# Patient Record
Sex: Male | Born: 1975 | Race: Black or African American | Hispanic: No | Marital: Married | Smoking: Current every day smoker
Health system: Southern US, Community
[De-identification: ages and names within clinical notes are randomized; demographics above are authoritative.]

## PROBLEM LIST (undated history)

## (undated) DIAGNOSIS — M797 Fibromyalgia: Secondary | ICD-10-CM

## (undated) DIAGNOSIS — I10 Essential (primary) hypertension: Secondary | ICD-10-CM

## (undated) DIAGNOSIS — F419 Anxiety disorder, unspecified: Secondary | ICD-10-CM

## (undated) HISTORY — DX: Anxiety disorder, unspecified: F41.9

## (undated) HISTORY — PX: NO PAST SURGERIES: SHX2092

---

## 2003-03-28 ENCOUNTER — Ambulatory Visit (HOSPITAL_BASED_OUTPATIENT_CLINIC_OR_DEPARTMENT_OTHER): Admission: RE | Admit: 2003-03-28 | Discharge: 2003-03-28 | Payer: Self-pay | Admitting: Orthopedic Surgery

## 2008-05-17 ENCOUNTER — Encounter: Admission: RE | Admit: 2008-05-17 | Discharge: 2008-05-17 | Payer: Self-pay | Admitting: Cardiology

## 2011-04-04 NOTE — Op Note (Signed)
   NAME:  James Good, James Good                       ACCOUNT NO.:  [id number]   MEDICAL RECORD NO.:  [id number]                   PATIENT TYPE:  AMB   LOCATION:  DSC                                  FACILITY:  [facility]   PHYSICIAN:  Rodney A. Chaney Malling, M.D.           DATE OF BIRTH:  Aug 27, 1976   DATE OF PROCEDURE:  03/28/2003  DATE OF DISCHARGE:                                 OPERATIVE REPORT   PREOPERATIVE DIAGNOSIS:  Fracture, base fifth metacarpal, right hand, with  dislocation.   POSTOPERATIVE DIAGNOSIS:  Fracture, base fifth metacarpal, right hand, with  dislocation.   OPERATION:  Closed reduction of fracture-dislocation fifth metacarpal and  pin fixation with 0.620 K-wire.   SURGEON:  Lenard Galloway. Chaney Malling, M.D.   ANESTHESIA:  General.   PROCEDURE:  The patient was placed on the operating table in the supine  position.  After satisfactory general anesthesia, the fingers were placed in  fingertrap.  C-arm was brought in place.  The hand and right upper extremity  was then prepped with DuraPrep and draped out in the usual manner.  The  fracture was felt.  This was reduced manually.  In a reduced position, a K-  wire was placed across the proximal end of the fifth metacarpal and across  the fracture line to stabilize the fracture.  This was visualized with the C-  arm, and excellent reduction and stabilization was achieved.  Excess wire  was removed.  A short arm cast was then applied, and the patient returned to  the recovery room in excellent condition.  Technically, the procedure went  extremely well.   FOLLOW-UP CARE:  Return to my office in one week.                                               Rodney A. Chaney Malling, M.D.    RAM/MEDQ  D:  03/28/2003  T:  03/29/2003  Job:  [id number]

## 2013-09-02 ENCOUNTER — Emergency Department (HOSPITAL_COMMUNITY)
Admission: EM | Admit: 2013-09-02 | Discharge: 2013-09-02 | Disposition: A | Discharge status: Home / Self Care | Payer: Self-pay | Attending: Emergency Medicine | Admitting: Emergency Medicine

## 2013-09-02 ENCOUNTER — Encounter (HOSPITAL_COMMUNITY): Payer: Self-pay | Admitting: Emergency Medicine

## 2013-09-02 ENCOUNTER — Emergency Department (HOSPITAL_COMMUNITY): Payer: Self-pay

## 2013-09-02 DIAGNOSIS — IMO0001 Reserved for inherently not codable concepts without codable children: Secondary | ICD-10-CM | POA: Insufficient documentation

## 2013-09-02 DIAGNOSIS — F17200 Nicotine dependence, unspecified, uncomplicated: Secondary | ICD-10-CM | POA: Insufficient documentation

## 2013-09-02 DIAGNOSIS — R569 Unspecified convulsions: Secondary | ICD-10-CM | POA: Insufficient documentation

## 2013-09-02 DIAGNOSIS — Z79899 Other long term (current) drug therapy: Secondary | ICD-10-CM | POA: Insufficient documentation

## 2013-09-02 DIAGNOSIS — R51 Headache: Secondary | ICD-10-CM | POA: Insufficient documentation

## 2013-09-02 LAB — COMPREHENSIVE METABOLIC PANEL
ALT: 13 U/L (ref 0–53)
AST: 23 U/L (ref 0–37)
Albumin: 3.8 g/dL (ref 3.5–5.2)
Alkaline Phosphatase: 87 U/L (ref 39–117)
BUN: 6 mg/dL (ref 6–23)
CO2: 23 mEq/L (ref 19–32)
Calcium: 9.2 mg/dL (ref 8.4–10.5)
Chloride: 103 mEq/L (ref 96–112)
Creatinine, Ser: 1.09 mg/dL (ref 0.50–1.35)
GFR calc Af Amer: >90 mL/min (ref >90)
GFR calc non Af Amer: 86 mL/min — ABNORMAL LOW (ref >90)
Glucose, Bld: 107 mg/dL — ABNORMAL HIGH (ref 70–99)
Potassium: 3.5 mEq/L (ref 3.5–5.1)
Sodium: 136 mEq/L (ref 135–145)
Total Bilirubin: 0.3 mg/dL (ref 0.3–1.2)
Total Protein: 7 g/dL (ref 6.0–8.3)

## 2013-09-02 LAB — CBC WITH DIFFERENTIAL/PLATELET
Basophils Absolute: 0 10*3/uL (ref 0.0–0.1)
Basophils Relative: 0 % (ref 0–1)
Eosinophils Absolute: 0.1 10*3/uL (ref 0.0–0.7)
Eosinophils Relative: 1 % (ref 0–5)
HCT: 45.4 % (ref 39.0–52.0)
Hemoglobin: 15.9 g/dL (ref 13.0–17.0)
Lymphocytes Relative: 18 % (ref 12–46)
Lymphs Abs: 2.1 10*3/uL (ref 0.7–4.0)
MCH: 32.6 pg (ref 26.0–34.0)
MCHC: 35 g/dL (ref 30.0–36.0)
MCV: 93.2 fL (ref 78.0–100.0)
Monocytes Absolute: 1.1 10*3/uL — ABNORMAL HIGH (ref 0.1–1.0)
Monocytes Relative: 10 % (ref 3–12)
Neutro Abs: 8.2 10*3/uL — ABNORMAL HIGH (ref 1.7–7.7)
Neutrophils Relative %: 71 % (ref 43–77)
Platelets: 235 10*3/uL (ref 150–400)
RBC: 4.87 MIL/uL (ref 4.22–5.81)
RDW: 14 % (ref 11.5–15.5)
WBC: 11.5 10*3/uL — ABNORMAL HIGH (ref 4.0–10.5)

## 2013-09-02 LAB — URINALYSIS, ROUTINE W REFLEX MICROSCOPIC
Bilirubin Urine: NEGATIVE
Glucose, UA: NEGATIVE mg/dL
Hgb urine dipstick: NEGATIVE
Ketones, ur: NEGATIVE mg/dL
Leukocytes, UA: NEGATIVE
Nitrite: NEGATIVE
Protein, ur: NEGATIVE mg/dL
Specific Gravity, Urine: 1.006 (ref 1.005–1.030)
Urobilinogen, UA: 0.2 mg/dL (ref 0.0–1.0)
pH: 5.5 (ref 5.0–8.0)

## 2013-09-02 LAB — CK: Total CK: 962 U/L — ABNORMAL HIGH (ref 7–232)

## 2013-09-02 MED ORDER — SODIUM CHLORIDE 0.9 % IV BOLUS (SEPSIS)
2000.0000 mL | Freq: Once | INTRAVENOUS | Status: AC
  Administered 2013-09-02: 2000 mL via INTRAVENOUS

## 2013-09-02 NOTE — ED Notes (Signed)
Patient transported to CT 

## 2013-09-02 NOTE — ED Notes (Signed)
Pt discharged home with all belongings, pt alert and ambulatory upon discharge, no new RX prescribed, pt verbalizes understanding of discharge instructions  

## 2013-09-02 NOTE — ED Provider Notes (Signed)
Medical screening examination/treatment/procedure(s) were conducted as a shared visit with non-physician practitioner(s) and myself.  I personally evaluated the patient during the encounter.  Significant other describes Pt in bed having witnessed few minutes episode of generalized tonic clonic seizure activity, then post-ictal snoring then woke with transient confusion, Pt now back to baseline, patient has amnesia for the event, patient denies headache neck pain back pain chest pain shortness breath palpitations abdominal pain focal weakness or numbness or incoordination change in speech or vision swallowing or other concerns. No tongue-biting noted.  Clinically doubt acute coronary syndrome / STEMI / CVA / SBI. Suspect ST elevation on ECG not from STEMI.    Hurman Horn, MD 09/18/13 1910

## 2013-09-02 NOTE — ED Notes (Signed)
Seizure pads placed on side rails

## 2013-09-02 NOTE — ED Notes (Signed)
EDPA Lawyer at bedside. 

## 2013-09-02 NOTE — ED Provider Notes (Signed)
CSN: [id number]     Arrival date & time 09/02/13  0447 History   First MD Initiated Contact with Patient 09/02/13 [phone]     Chief Complaint  Patient presents with  . Seizures   (Consider location/radiation/quality/duration/timing/severity/associated sxs/prior Treatment) HPI Patient presents emergency department with possible seizure activity.  Patient's significant other states the patient was shaking all over and finally awoke and was somewhat confused.  Patient denies chest pain, shortness of breath, nausea, vomiting, blurred vision, weakness, numbness, dizziness, or fever.  Patient has a mild headache, and muscle cramping.  Patient has never had a seizure in the past History reviewed. No pertinent past medical history. History reviewed. No pertinent past surgical history. History reviewed. No pertinent family history. History  Substance Use Topics  . Smoking status: Current Every Day Smoker  . Smokeless tobacco: Not on file  . Alcohol Use: Yes    Review of Systems All other systems negative except as documented in the HPI. All pertinent positives and negatives as reviewed in the HPI. Allergies  Review of patient's allergies indicates no known allergies.  Home Medications   Current Outpatient Rx  Name  Route  Sig  Dispense  Refill  . traZODone (DESYREL) 50 MG tablet   Oral   Take 50 mg by mouth at bedtime.          BP 120/68  Pulse 56  Temp(Src) 98.3 F (36.8 C)  Resp 13  SpO2 99% Physical Exam  Nursing note and vitals reviewed. Constitutional: He is oriented to person, place, and time. He appears well-developed and well-nourished.  Eyes: Pupils are equal, round, and reactive to light.  Neck: Normal range of motion. Neck supple.  Cardiovascular: Normal rate, regular rhythm and normal heart sounds.  Exam reveals no gallop and no friction rub.   No murmur heard. Pulmonary/Chest: Effort normal and breath sounds normal.  Neurological: He is alert and oriented to  person, place, and time. He exhibits normal muscle tone. Coordination normal.  Skin: Skin is warm and dry.    ED Course  Procedures (including critical care time) Labs Review Labs Reviewed  CBC WITH DIFFERENTIAL - Abnormal; Notable for the following:    WBC 11.5 (*)    Neutro Abs 8.2 (*)    Monocytes Absolute 1.1 (*)    All other components within normal limits  COMPREHENSIVE METABOLIC PANEL - Abnormal; Notable for the following:    Glucose, Bld 107 (*)    GFR calc non Af Amer 86 (*)    All other components within normal limits  CK - Abnormal; Notable for the following:    Total CK 962 (*)    All other components within normal limits  URINALYSIS, ROUTINE W REFLEX MICROSCOPIC   Imaging Review Ct Head Wo Contrast  09/02/2013   *RADIOLOGY REPORT*  Clinical Data: Seizure  CT HEAD WITHOUT CONTRAST  Technique:  Contiguous axial images were obtained from the base of the skull through the vertex without contrast. Study was obtained within 24 hours of patient arrival at the emergency department.  Comparison: May 17, 2008  Findings:  The ventricles are normal in size and configuration. There is no mass, hemorrhage, extra-axial fluid collection, or midline shift.  Gray-white compartments are normal.  No demonstrable acute infarct.  Bony calvarium appears intact. Visualized mastoid air cells are clear.  IMPRESSION: Study within normal limits.   Original Report Authenticated By: Bretta Bang, M.D.    EKG Interpretation     Ventricular Rate:  65 PR  Interval:  144 QRS Duration: 70 QT Interval:  380 QTC Calculation: 396 R Axis:   114 Text Interpretation:  Right and left arm electrode reversal, interpretation assumes no reversal Sinus rhythm Probable right ventricular hypertrophy Nonspecific ST elevation Diffuse leads No previous ECGs available           Patient will be given outpatient followup for possible seizure.  Patient has been stable here in the emergency department with no  issues.  Patient is advised this could be a first-time seizure, but followup will be needed to further tests for any other possibilities.  Patient is advised this test results.  Told to return here as needed.  All questions were answered.  The patient voices an understanding    Carlyle Dolly, [place] 09/02/13 1011

## 2013-09-02 NOTE — ED Notes (Signed)
Family at bedside. 

## 2013-09-02 NOTE — ED Notes (Signed)
EDPA at bedside aware of pain, no new orders

## 2013-09-02 NOTE — ED Notes (Signed)
Pt returned from CT, upon arrival to [place] NT placed pt back on to heart monitor, applied seizure pads, and obtained orthostatic VS

## 2013-09-02 NOTE — ED Notes (Addendum)
Pt arrives via EMS from home with seizure activity that lasted about 1 min.  4mg  Zofran given.  Pt now C/O headache

## 2014-06-22 ENCOUNTER — Emergency Department (HOSPITAL_COMMUNITY)
Admission: EM | Admit: 2014-06-22 | Discharge: 2014-06-22 | Disposition: A | Discharge status: Home / Self Care | Payer: Self-pay | Attending: Emergency Medicine | Admitting: Emergency Medicine

## 2014-06-22 ENCOUNTER — Encounter (HOSPITAL_COMMUNITY): Payer: Self-pay | Admitting: Emergency Medicine

## 2014-06-22 DIAGNOSIS — R5383 Other fatigue: Secondary | ICD-10-CM

## 2014-06-22 DIAGNOSIS — G40909 Epilepsy, unspecified, not intractable, without status epilepticus: Secondary | ICD-10-CM | POA: Insufficient documentation

## 2014-06-22 DIAGNOSIS — Z79899 Other long term (current) drug therapy: Secondary | ICD-10-CM | POA: Insufficient documentation

## 2014-06-22 DIAGNOSIS — F17200 Nicotine dependence, unspecified, uncomplicated: Secondary | ICD-10-CM | POA: Insufficient documentation

## 2014-06-22 DIAGNOSIS — R569 Unspecified convulsions: Secondary | ICD-10-CM | POA: Insufficient documentation

## 2014-06-22 DIAGNOSIS — R5381 Other malaise: Secondary | ICD-10-CM | POA: Insufficient documentation

## 2014-06-22 LAB — URINE MICROSCOPIC-ADD ON: RBC / HPF: NONE SEEN RBC/hpf (ref <3)

## 2014-06-22 LAB — URINALYSIS, ROUTINE W REFLEX MICROSCOPIC
Bilirubin Urine: NEGATIVE
Glucose, UA: NEGATIVE mg/dL
Ketones, ur: NEGATIVE mg/dL
Leukocytes, UA: NEGATIVE
Nitrite: NEGATIVE
Protein, ur: NEGATIVE mg/dL
Specific Gravity, Urine: 1.012 (ref 1.005–1.030)
Urobilinogen, UA: 0.2 mg/dL (ref 0.0–1.0)
pH: 5.5 (ref 5.0–8.0)

## 2014-06-22 LAB — I-STAT CHEM 8, ED
BUN: <3 mg/dL — ABNORMAL LOW (ref 6–23)
Calcium, Ion: 1.23 mmol/L (ref 1.12–1.23)
Chloride: 105 mEq/L (ref 96–112)
Creatinine, Ser: 1.3 mg/dL (ref 0.50–1.35)
Glucose, Bld: 101 mg/dL — ABNORMAL HIGH (ref 70–99)
HCT: 53 % — ABNORMAL HIGH (ref 39.0–52.0)
Hemoglobin: 18 g/dL — ABNORMAL HIGH (ref 13.0–17.0)
Potassium: 3.5 mEq/L — ABNORMAL LOW (ref 3.7–5.3)
Sodium: 139 mEq/L (ref 137–147)
TCO2: 16 mmol/L (ref 0–100)

## 2014-06-22 LAB — CBG MONITORING, ED: GLUCOSE-CAPILLARY: 103 mg/dL — AB (ref 70–99)

## 2014-06-22 MED ORDER — LEVETIRACETAM 500 MG PO TABS: 500.0000 mg | ORAL_TABLET | Freq: Two times a day (BID) | ORAL | Status: DC

## 2014-06-22 MED ORDER — LEVETIRACETAM IN NACL 500 MG/100ML IV SOLN
500.0000 mg | Freq: Once | INTRAVENOUS | Status: AC
  Administered 2014-06-22: 500 mg via INTRAVENOUS
  Filled 2014-06-22: qty 100

## 2014-06-22 NOTE — ED Provider Notes (Signed)
Medical screening examination/treatment/procedure(s) were conducted as a shared visit with non-physician practitioner(s) and myself.  I personally evaluated the patient during the encounter  Please see my separate respective documentation pertaining to this patient encounter   Vida RollerBrian D Halil Rentz, MD 06/22/14 [phone]

## 2014-06-22 NOTE — Discharge Instructions (Signed)
Seizure, Adult °A seizure is abnormal electrical activity in the brain. Seizures usually last from 30 seconds to 2 minutes. There are various types of seizures. °Before a seizure, you may have a warning sensation (aura) that a seizure is about to occur. An aura may include the following symptoms:  °· Fear or anxiety. °· Nausea. °· Feeling like the room is spinning (vertigo). °· Vision changes, such as seeing flashing lights or spots. °Common symptoms during a seizure include: °· A change in attention or behavior (altered mental status). °· Convulsions with rhythmic jerking movements. °· Drooling. °· Rapid eye movements. °· Grunting. °· Loss of bladder and bowel control. °· Bitter taste in the mouth. °· Tongue biting. °After a seizure, you may feel confused and sleepy. You may also have an injury resulting from convulsions during the seizure. °HOME CARE INSTRUCTIONS  °· If you are given medicines, take them exactly as prescribed by your health care provider. °· Keep all follow-up appointments as directed by your health care provider. °· Do not swim or drive or engage in risky activity during which a seizure could cause further injury to you or others until your health care provider says it is OK. °· Get adequate rest. °· Teach friends and family what to do if you have a seizure. They should: °· Lay you on the ground to prevent a fall. °· Put a cushion under your head. °· Loosen any tight clothing around your neck. °· Turn you on your side. If vomiting occurs, this helps keep your airway clear. °· Stay with you until you recover. °· Know whether or not you need emergency care. °SEEK IMMEDIATE MEDICAL CARE IF: °· The seizure lasts longer than 5 minutes. °· The seizure is severe or you do not wake up immediately after the seizure. °· You have an altered mental status after the seizure. °· You are having more frequent or worsening seizures. °Someone should drive you to the emergency department or call local emergency  services (911 in [place] °MAKE SURE YOU: °· Understand these instructions. °· Will watch your condition. °· Will get help right away if you are not doing well or get worse. °Document Released: 10/31/2000 Document Revised: 08/24/2013 Document Reviewed: 06/15/2013 °ExitCare® Patient Information ©2015 [employer]. This information is not intended to replace advice given to you by your health care provider. Make sure you discuss any questions you have with your health care provider. ° °Driving and Equipment Restrictions °Some medical problems make it dangerous to drive, ride a bike, or use machines. Some of these problems are: °· A hard blow to the head (concussion). °· Passing out (fainting). °· Twitching and shaking (seizures). °· Low blood sugar. °· Taking medicine to help you relax (sedatives). °· Taking pain medicines. °· Wearing an eye patch. °· Wearing splints. This can make it hard to use parts of your body that you need to drive safely. °HOME CARE  °· Do not drive until your doctor says it is okay. °· Do not use machines until your doctor says it is okay. °You may need a form signed by your doctor (medical release) before you can drive again. You may also need this form before you do other tasks where you need to be fully alert. °MAKE SURE YOU: °· Understand these instructions. °· Will watch your condition. °· Will get help right away if you are not doing well or get worse. °Document Released: 12/11/2004 Document Revised: 01/26/2012 Document Reviewed: 03/13/2010 °ExitCare® Patient Information ©2015 [employer].   This information is not intended to replace advice given to you by your health care provider. Make sure you discuss any questions you have with your health care provider. ° °

## 2014-06-22 NOTE — ED Provider Notes (Signed)
CSN: [id number]     Arrival date & time 06/22/14  0621 History   First MD Initiated Contact with Patient 06/22/14 [phone]     Chief Complaint  Patient presents with  . Seizures     (Consider location/radiation/quality/duration/timing/severity/associated sxs/prior Treatment) HPI Pt is a 38yo male brought to ED via EMS from home with reports from girlfriend pt had tonic-clonic type seizure around 0530 this morning. Pt was sleeping at the time.  Seizure activity lasted "a couple minutes"  No head trauma, no lost of bowel or bladder, no biting of his tongue.  Pt denies chest pain, SOB, abdominal pain, or any other complaints at this time. Hx of possible first seizure 08/2013, was advised to f/u with neurology but never did.  Denies recent illness or injury. Does report drinking 1 shot but denies illicit drug use. Denies large consumption of alcohol or being in alcohol withdrawal.   History reviewed. No pertinent past medical history. History reviewed. No pertinent past surgical history. No family history on file. History  Substance Use Topics  . Smoking status: Current Every Day Smoker  . Smokeless tobacco: Not on file  . Alcohol Use: Yes    Review of Systems  Constitutional: Negative for fever and chills.  Eyes: Negative for photophobia, pain and visual disturbance.  Respiratory: Negative for cough and shortness of breath.   Cardiovascular: Negative for chest pain and palpitations.  Gastrointestinal: Negative for nausea, vomiting, abdominal pain and diarrhea.  Musculoskeletal: Negative for arthralgias, myalgias, neck pain and neck stiffness.  Neurological: Positive for seizures and weakness. Negative for dizziness, light-headedness, numbness and headaches.  All other systems reviewed and are negative.     Allergies  Review of patient's allergies indicates no known allergies.  Home Medications   Prior to Admission medications   Medication Sig Start Date End Date Taking? Authorizing  Provider  OVER THE COUNTER MEDICATION Take 1 tablet by mouth daily. Allergy medication   Yes Historical Provider, MD  levETIRAcetam (KEPPRA) 500 MG tablet Take 1 tablet (500 mg total) by mouth 2 (two) times daily. 06/22/14   Junius Finner, PA-C   BP 129/79  Pulse 92  Resp 15 Physical Exam  Nursing note and vitals reviewed. Constitutional: He is oriented to person, place, and time. He appears well-developed and well-nourished.  Pt lying in exam bed, awake, oriented, mildly fatigued  HENT:  Head: Normocephalic and atraumatic.  Eyes: Conjunctivae are normal. No scleral icterus.  Neck: Normal range of motion.  Cardiovascular: Normal rate, regular rhythm and normal heart sounds.   Regular rate and rhythm  Pulmonary/Chest: Effort normal and breath sounds normal. No respiratory distress. He has no wheezes. He has no rales. He exhibits no tenderness.  No respiratory distress, able to speak in full sentences w/o difficulty. Lungs; CTAB  Abdominal: Soft. Bowel sounds are normal. He exhibits no distension and no mass. There is no tenderness. There is no rebound and no guarding.  Soft, non-distended, non-tender.  Musculoskeletal: Normal range of motion.  Neurological: He is alert and oriented to person, place, and time. No cranial nerve deficit.  Oriented to person, place, and time. Normal coordination. Good muscle tone. 5/5 strength in upper and lower extremities bilaterally.  Skin: Skin is warm and dry.    ED Course  Procedures (including critical care time) Labs Review Labs Reviewed  URINALYSIS, ROUTINE W REFLEX MICROSCOPIC - Abnormal; Notable for the following:    APPearance CLOUDY (*)    Hgb urine dipstick MODERATE (*)  All other components within normal limits  URINE MICROSCOPIC-ADD ON - Abnormal; Notable for the following:    Casts HYALINE CASTS (*)    All other components within normal limits  CBG MONITORING, ED - Abnormal; Notable for the following:    Glucose-Capillary 103 (*)     All other components within normal limits  I-STAT CHEM 8, ED - Abnormal; Notable for the following:    Potassium 3.5 (*)    BUN <3 (*)    Glucose, Bld 101 (*)    Hemoglobin 18.0 (*)    HCT 53.0 (*)    All other components within normal limits    Imaging Review No results found.   EKG Interpretation None      MDM   Final diagnoses:  Seizure    Pt presenting to ED with reports of seizure that started around 0530 this morning. This is pt's 2nd witnessed seizure since 08/2013.  Pt did have a post-ictal state but alert and oriented while in ED w/o additional seizures.  Pt denies heavy alcohol consumption, alcohol withdrawal or illicit drug use.  No other significant PMH.  Medical records reviewed including normal head CT after first witnessed seizure.  Labs at that time were unremarkable.  Today labs: elevated Hgb/Hct-possible mild dehydration.  Discussed pt with Dr. Hyacinth MeekerMiller who also examined pt.  Will start pt on Keppra 500mg  BID, first dose given in ED.  Strongly encouraged pt [facility] further evaluation for seizures as well as medication adjustments as needed.   Return precautions provided. Pt verbalized understanding and agreement with tx plan.     Junius FinnerErin O'Malley, PA-C 06/22/14 [phone]

## 2014-06-22 NOTE — ED Notes (Signed)
Erin, PA at bedside. 

## 2014-06-22 NOTE — ED Notes (Addendum)
PER EMS: pt from home, pts girlfriend reports witnessing tonic clonic like seizure at 0530 this morning lasting a couple of minutes. Upon EMS arrival pt postictal, CBG-90, HR-104, BP-131/73. [facility] still slow to speak but Pt A&OX4. Seizure pads placed on stretcher. Pt reports seizure about a month ago. Pt denies pain, incontinence and no injury to tongue noted.

## 2014-06-22 NOTE — ED Provider Notes (Signed)
38 year old male, history of seizure that occurred approximately 10 months ago, did not followup for further seizure testing. He presents back with reported seizure activity according to family member and paramedics. This occurred prior to their arrival, this was not witnessed by the paramedics, he did have a postictal state. The patient does not remember anything from this morning, he denies headache, tongue biting, urinary incontinence and denies heavy alcohol use or withdrawal. On exam the patient is cooperative, soft abdomen, clear heart and lung sounds, normal neurologic exam other than feeling dazed and confused. We'll obtain further information from family members if they accompanied the patient, start antiseizure medications as this would be his second seizure witnessed by others in the last year.  Medical screening examination/treatment/procedure(s) were conducted as a shared visit with non-physician practitioner(s) and myself.  I personally evaluated the patient during the encounter.  Clinical Impression: Seizure      Vida RollerBrian D Catrice Zuleta, MD 06/22/14 [phone]

## 2016-08-21 ENCOUNTER — Other Ambulatory Visit (HOSPITAL_COMMUNITY): Payer: Self-pay | Admitting: Respiratory Therapy

## 2016-08-21 DIAGNOSIS — Z87898 Personal history of other specified conditions: Secondary | ICD-10-CM

## 2018-12-10 ENCOUNTER — Other Ambulatory Visit: Payer: Self-pay

## 2018-12-10 ENCOUNTER — Encounter (HOSPITAL_COMMUNITY): Payer: Self-pay | Admitting: Emergency Medicine

## 2018-12-10 ENCOUNTER — Encounter: Payer: Self-pay | Admitting: Neurology

## 2018-12-10 ENCOUNTER — Ambulatory Visit (INDEPENDENT_AMBULATORY_CARE_PROVIDER_SITE_OTHER): Payer: Managed Care, Other (non HMO) | Admitting: Family Medicine

## 2018-12-10 ENCOUNTER — Encounter: Payer: Self-pay | Admitting: Family Medicine

## 2018-12-10 ENCOUNTER — Emergency Department (HOSPITAL_COMMUNITY)
Admission: EM | Admit: 2018-12-10 | Discharge: 2018-12-10 | Disposition: A | Discharge status: Home / Self Care | Payer: Managed Care, Other (non HMO) | Attending: Emergency Medicine | Admitting: Emergency Medicine

## 2018-12-10 VITALS — BP 120/80 | HR 70 | Temp 97.9°F | Ht 68.0 in | Wt 182.0 lb

## 2018-12-10 DIAGNOSIS — F17210 Nicotine dependence, cigarettes, uncomplicated: Secondary | ICD-10-CM | POA: Diagnosis not present

## 2018-12-10 DIAGNOSIS — I10 Essential (primary) hypertension: Secondary | ICD-10-CM | POA: Diagnosis not present

## 2018-12-10 DIAGNOSIS — Z79899 Other long term (current) drug therapy: Secondary | ICD-10-CM | POA: Diagnosis not present

## 2018-12-10 DIAGNOSIS — R0683 Snoring: Secondary | ICD-10-CM | POA: Diagnosis not present

## 2018-12-10 DIAGNOSIS — R569 Unspecified convulsions: Secondary | ICD-10-CM | POA: Diagnosis not present

## 2018-12-10 DIAGNOSIS — G40909 Epilepsy, unspecified, not intractable, without status epilepticus: Secondary | ICD-10-CM | POA: Insufficient documentation

## 2018-12-10 DIAGNOSIS — Z7689 Persons encountering health services in other specified circumstances: Secondary | ICD-10-CM

## 2018-12-10 DIAGNOSIS — H1031 Unspecified acute conjunctivitis, right eye: Secondary | ICD-10-CM

## 2018-12-10 DIAGNOSIS — H00011 Hordeolum externum right upper eyelid: Secondary | ICD-10-CM

## 2018-12-10 HISTORY — DX: Essential (primary) hypertension: I10

## 2018-12-10 LAB — CBC
HCT: 48 % (ref 39.0–52.0)
Hemoglobin: 15.9 g/dL (ref 13.0–17.0)
MCH: 31.5 pg (ref 26.0–34.0)
MCHC: 33.1 g/dL (ref 30.0–36.0)
MCV: 95 fL (ref 80.0–100.0)
Platelets: 266 10*3/uL (ref 150–400)
RBC: 5.05 MIL/uL (ref 4.22–5.81)
RDW: 13.6 % (ref 11.5–15.5)
WBC: 8.6 10*3/uL (ref 4.0–10.5)
nRBC: 0 % (ref 0.0–0.2)

## 2018-12-10 LAB — BASIC METABOLIC PANEL
Anion gap: 14 (ref 5–15)
BUN: 11 mg/dL (ref 6–20)
CO2: 17 mmol/L — ABNORMAL LOW (ref 22–32)
Calcium: 9.5 mg/dL (ref 8.9–10.3)
Chloride: 105 mmol/L (ref 98–111)
Creatinine, Ser: 1.34 mg/dL — ABNORMAL HIGH (ref 0.61–1.24)
GFR calc Af Amer: >60 mL/min (ref >60)
Glucose, Bld: 84 mg/dL (ref 70–99)
Potassium: 4.3 mmol/L (ref 3.5–5.1)
Sodium: 136 mmol/L (ref 135–145)

## 2018-12-10 LAB — CBG MONITORING, ED: Glucose-Capillary: 109 mg/dL — ABNORMAL HIGH (ref 70–99)

## 2018-12-10 MED ORDER — POLYMYXIN B-TRIMETHOPRIM 10000-0.1 UNIT/ML-% OP SOLN: 1.0000 [drp] | OPHTHALMIC | 0 refills | Status: AC

## 2018-12-10 MED ORDER — LEVETIRACETAM IN NACL 1000 MG/100ML IV SOLN
1000.0000 mg | Freq: Once | INTRAVENOUS | Status: AC
  Administered 2018-12-10: 1000 mg via INTRAVENOUS
  Filled 2018-12-10: qty 100

## 2018-12-10 MED ORDER — LEVETIRACETAM 500 MG PO TABS: 500.0000 mg | ORAL_TABLET | Freq: Two times a day (BID) | ORAL | 0 refills | Status: DC

## 2018-12-10 MED ORDER — ACETAMINOPHEN 325 MG PO TABS
650.0000 mg | ORAL_TABLET | Freq: Once | ORAL | Status: AC
  Administered 2018-12-10: 650 mg via ORAL
  Filled 2018-12-10: qty 2

## 2018-12-10 NOTE — ED Triage Notes (Signed)
Pt BIB [facility] for seizures. EMS reports the pt's wife called 9-1-1 because he was seizing. EMS reports that the wife told them she felt the bed shaking and when she woke up noticed the cause of it was her husband having a seizure. EMS reports a history of 1 prior seizure 6-7 years ago but never seen a neurologist. EMS reports when they arrived the pt was postictal and they had to carry the pt down 3 flights of stairs. EMS advised 12 lead unremarkable, vital signs stable, and IV as documented.   Pt states he does not know what is going on and feels fine. Pt believes his wife is crazy and nothing happened.

## 2018-12-10 NOTE — ED Notes (Signed)
Pt ambulatory at discharge. Wheeled to waiting area to be picked up by wife. Discharge instructions reviewed with patient. He does not believe he had a seizure.

## 2018-12-10 NOTE — Discharge Instructions (Addendum)
We saw you in the ER for seizure. All the results in the ER are normal, labs and imaging.  Please take the medications prescribed and follow-up with a neurologist for further care.  [place] law prevents people with seizures or fainting from driving or operating dangerous machinery until they are free of seizures or fainting for 6 months.

## 2018-12-10 NOTE — ED Provider Notes (Signed)
[facility] EMERGENCY DEPARTMENT Provider Note   CSN: [id number] Arrival date & time: 12/10/18  0356     History   Chief Complaint Chief Complaint  Patient presents with  . Seizures    HPI James Good is a 43 y.o. male.  HPI  Patient does not recall circumstances surrounding his primary visit.  43 year old male with history of hypertension and questionable seizures comes in with chief complaint of seizure.  According to the EMS report, patient's wife woke up in the middle night with her bed shaking.  She noted that patient was having generalized tonic-clonic seizures.  She called EMS.  According to the EMS patient was postictal and he had to be carried down flight of stairs by them.  Patient does not recall how he came to the emergency room.  Patient denies having any history of seizures.  According to our chart review, it appears that patient was diagnosed with seizure disorder few years ago and started on Keppra.  Patient states that he has never taken seizure medications.  He denies any recent trauma, infection.  He admits to occasional alcohol use and occasional marijuana use, however last night he did not use either.   Past Medical History:  Diagnosis Date  . Hypertension     There are no active problems to display for this patient.   History reviewed. No pertinent surgical history.      Home Medications    Prior to Admission medications   Medication Sig Start Date End Date Taking? Authorizing Provider  amLODipine (NORVASC) 10 MG tablet Take 10 mg by mouth daily.   Yes [provider]  levETIRAcetam (KEPPRA) 500 MG tablet Take 1 tablet (500 mg total) by mouth 2 (two) times daily. Patient not taking: Reported on 12/10/2018 06/22/14   Rolla Plate    Family History No family history on file.  Social History Social History   Tobacco Use  . Smoking status: Current Every Day Smoker  . Smokeless tobacco: Never Used    Substance Use Topics  . Alcohol use: Yes  . Drug use: Yes    Types: Methamphetamines     Allergies   Patient has no known allergies.   Review of Systems Review of Systems  Constitutional: Positive for activity change.  Respiratory: Negative for shortness of breath.   Cardiovascular: Negative for chest pain.  Gastrointestinal: Negative for nausea and vomiting.  Allergic/Immunologic: Negative for immunocompromised state.  Neurological: Positive for seizures. Negative for weakness, light-headedness, numbness and headaches.  Hematological: Does not bruise/bleed easily.     Physical Exam Updated Vital Signs BP 105/64   Pulse 70   Temp 98.2 F (36.8 C)   Resp 18   Ht 6' (1.829 m)   Wt 90.7 kg   SpO2 100%   BMI 27.12 kg/m   Physical Exam Vitals signs and nursing note reviewed.  Constitutional:      Appearance: He is well-developed.  HENT:     Head: Atraumatic.  Neck:     Musculoskeletal: Neck supple.  Cardiovascular:     Rate and Rhythm: Normal rate.  Pulmonary:     Effort: Pulmonary effort is normal.  Skin:    General: Skin is warm.  Neurological:     Mental Status: He is alert and oriented to person, place, and time.     Cranial Nerves: No cranial nerve deficit.     Sensory: No sensory deficit.     Motor: No weakness.  Coordination: Coordination normal.      ED Treatments / Results  Labs (all labs ordered are listed, but only abnormal results are displayed) Labs Reviewed  BASIC METABOLIC PANEL - Abnormal; Notable for the following components:      Result Value   CO2 17 (*)    Creatinine, Ser 1.34 (*)    All other components within normal limits  CBC  CBG MONITORING, ED    EKG None  Radiology No results found.  Procedures Procedures (including critical care time)  Medications Ordered in ED Medications  levETIRAcetam (KEPPRA) IVPB 1000 mg/100 mL premix (has no administration in time range)     Initial Impression / Assessment and  Plan / ED Course  I have reviewed the triage vital signs and the nursing notes.  Pertinent labs & imaging results that were available during my care of the patient were reviewed by me and considered in my medical decision making (see chart for details).     Patient comes in with chief complaint of seizure.  DDx: -Seizure disorder -Trauma -ICH -Electrolyte abnormality -Metabolic derangement -Toxin induced seizures -Medication side effects -Hypoxia -Hypoglycemia  It appears that patient was diagnosed with seizure several years ago.  He has not been taking any medications, and states that he has not had a seizure in several years.  Although patient is denying that he had seizures, I am fairly convinced that he actually did have an episode based on EMS report.  We will give patient 1 g of Keppra. Basic labs have been ordered.  Based on our evaluation it does not appear that patient has intracranial bleed, we will continue to monitor him closely..    Final Clinical Impressions(s) / ED Diagnoses   Final diagnoses:  Seizure [facility](HCC)    ED Discharge Orders    None       Derwood KaplanNanavati, Otha Monical, MD 12/10/18 [phone]

## 2018-12-10 NOTE — Patient Instructions (Addendum)
Steps to Quit Smoking  Smoking tobacco can be bad for your health. It can also affect almost every organ in your body. Smoking puts you and people around you at risk for many serious long-lasting (chronic) diseases. Quitting smoking is hard, but it is one of the best things that you can do for your health. It is never too late to quit. What are the benefits of quitting smoking? When you quit smoking, you lower your risk for getting serious diseases and conditions. They can include:  Lung cancer or lung disease.  Heart disease.  Stroke.  Heart attack.  Not being able to have children (infertility).  Weak bones (osteoporosis) and broken bones (fractures). If you have coughing, wheezing, and shortness of breath, those symptoms may get better when you quit. You may also get sick less often. If you are pregnant, quitting smoking can help to lower your chances of having a baby of low birth weight. What can I do to help me quit smoking? Talk with your doctor about what can help you quit smoking. Some things you can do (strategies) include:  Quitting smoking totally, instead of slowly cutting back how much you smoke over a period of time.  Going to in-person counseling. You are more likely to quit if you go to many counseling sessions.  Using resources and support systems, such as: ? Online chats with a counselor. ? Phone quitlines. ? Printed self-help materials. ? Support groups or group counseling. ? Text messaging programs. ? Mobile phone apps or applications.  Taking medicines. Some of these medicines may have nicotine in them. If you are pregnant or breastfeeding, do not take any medicines to quit smoking unless your doctor says it is okay. Talk with your doctor about counseling or other things that can help you. Talk with your doctor about using more than one strategy at the same time, such as taking medicines while you are also going to in-person counseling. This can help make  quitting easier. What things can I do to make it easier to quit? Quitting smoking might feel very hard at first, but there is a lot that you can do to make it easier. Take these steps:  Talk to your family and friends. Ask them to support and encourage you.  Call phone quitlines, reach out to support groups, or work with a counselor.  Ask people who smoke to not smoke around you.  Avoid places that make you want (trigger) to smoke, such as: ? Bars. ? Parties. ? Smoke-break areas at work.  Spend time with people who do not smoke.  Lower the stress in your life. Stress can make you want to smoke. Try these things to help your stress: ? Getting regular exercise. ? Deep-breathing exercises. ? Yoga. ? Meditating. ? Doing a body scan. To do this, close your eyes, focus on one area of your body at a time from head to toe, and notice which parts of your body are tense. Try to relax the muscles in those areas.  Download or buy apps on your mobile phone or tablet that can help you stick to your quit plan. There are many free apps, such as QuitGuide from the CDC (Centers for Disease Control and Prevention). You can find more support from smokefree.gov and other websites. This information is not intended to replace advice given to you by your health care provider. Make sure you discuss any questions you have with your health care provider. Document Released: 08/30/2009 Document Revised: 07/01/2016   Document Reviewed: 03/20/2015 Elsevier Interactive Patient Education  2019 [employer].  Managing Your Hypertension Hypertension is commonly called high blood pressure. This is when the force of your blood pressing against the walls of your arteries is too strong. Arteries are blood vessels that carry blood from your heart throughout your body. Hypertension forces the heart to work harder to pump blood, and may cause the arteries to become narrow or stiff. Having untreated or uncontrolled hypertension  can cause heart attack, stroke, kidney disease, and other problems. What are blood pressure readings? A blood pressure reading consists of a higher number over a lower number. Ideally, your blood pressure should be below 120/80. The first ("top") number is called the systolic pressure. It is a measure of the pressure in your arteries as your heart beats. The second ("bottom") number is called the diastolic pressure. It is a measure of the pressure in your arteries as the heart relaxes. What does my blood pressure reading mean? Blood pressure is classified into four stages. Based on your blood pressure reading, your health care provider may use the following stages to determine what type of treatment you need, if any. Systolic pressure and diastolic pressure are measured in a unit called mm Hg. Normal  Systolic pressure: below 120.  Diastolic pressure: below 80. Elevated  Systolic pressure: 120-129.  Diastolic pressure: below 80. Hypertension stage 1  Systolic pressure: 130-139.  Diastolic pressure: 80-89. Hypertension stage 2  Systolic pressure: 140 or above.  Diastolic pressure: 90 or above. What health risks are associated with hypertension? Managing your hypertension is an important responsibility. Uncontrolled hypertension can lead to:  A heart attack.  A stroke.  A weakened blood vessel (aneurysm).  Heart failure.  Kidney damage.  Eye damage.  Metabolic syndrome.  Memory and concentration problems. What changes can I make to manage my hypertension? Hypertension can be managed by making lifestyle changes and possibly by taking medicines. Your health care provider will help you make a plan to bring your blood pressure within a normal range. Eating and drinking   Eat a diet that is high in fiber and potassium, and low in salt (sodium), added sugar, and fat. An example eating plan is called the DASH (Dietary Approaches to Stop Hypertension) diet. To eat this  way: ? Eat plenty of fresh fruits and vegetables. Try to fill half of your plate at each meal with fruits and vegetables. ? Eat whole grains, such as whole wheat pasta, brown rice, or whole grain bread. Fill about one quarter of your plate with whole grains. ? Eat low-fat diary products. ? Avoid fatty cuts of meat, processed or cured meats, and poultry with skin. Fill about one quarter of your plate with lean proteins such as fish, chicken without skin, beans, eggs, and tofu. ? Avoid premade and processed foods. These tend to be higher in sodium, added sugar, and fat.  Reduce your daily sodium intake. Most people with hypertension should eat less than 1,500 mg of sodium a day.  Limit alcohol intake to no more than 1 drink a day for nonpregnant women and 2 drinks a day for men. One drink equals 12 oz of beer, 5 oz of wine, or 1 oz of hard liquor. Lifestyle  Work with your health care provider to maintain a healthy body weight, or to lose weight. Ask what an ideal weight is for you.  Get at least 30 minutes of exercise that causes your heart to beat faster (aerobic exercise) most days  of the week. Activities may include walking, swimming, or biking.  Include exercise to strengthen your muscles (resistance exercise), such as weight lifting, as part of your weekly exercise routine. Try to do these types of exercises for 30 minutes at least 3 days a week.  Do not use any products that contain nicotine or tobacco, such as cigarettes and e-cigarettes. If you need help quitting, ask your health care provider.  Control any long-term (chronic) conditions you have, such as high cholesterol or diabetes. Monitoring  Monitor your blood pressure at home as told by your health care provider. Your personal target blood pressure may vary depending on your medical conditions, your age, and other factors.  Have your blood pressure checked regularly, as often as told by your health care provider. Working with  your health care provider  Review all the medicines you take with your health care provider because there may be side effects or interactions.  Talk with your health care provider about your diet, exercise habits, and other lifestyle factors that may be contributing to hypertension.  Visit your health care provider regularly. Your health care provider can help you create and adjust your plan for managing hypertension. Will I need medicine to control my blood pressure? Your health care provider may prescribe medicine if lifestyle changes are not enough to get your blood pressure under control, and if:  Your systolic blood pressure is 130 or higher.  Your diastolic blood pressure is 80 or higher. Take medicines only as told by your health care provider. Follow the directions carefully. Blood pressure medicines must be taken as prescribed. The medicine does not work as well when you skip doses. Skipping doses also puts you at risk for problems. Contact a health care provider if:  You think you are having a reaction to medicines you have taken.  You have repeated (recurrent) headaches.  You feel dizzy.  You have swelling in your ankles.  You have trouble with your vision. Get help right away if:  You develop a severe headache or confusion.  You have unusual weakness or numbness, or you feel faint.  You have severe pain in your chest or abdomen.  You vomit repeatedly.  You have trouble breathing. Summary  Hypertension is when the force of blood pumping through your arteries is too strong. If this condition is not controlled, it may put you at risk for serious complications.  Your personal target blood pressure may vary depending on your medical conditions, your age, and other factors. For most people, a normal blood pressure is less than 120/80.  Hypertension is managed by lifestyle changes, medicines, or both. Lifestyle changes include weight loss, eating a healthy, low-sodium  diet, exercising more, and limiting alcohol. This information is not intended to replace advice given to you by your health care provider. Make sure you discuss any questions you have with your health care provider. Document Released: 07/28/2012 Document Revised: 10/01/2016 Document Reviewed: 10/01/2016 Elsevier Interactive Patient Education  2019 Elsevier Inc.  Stye  A stye, also known as a hordeolum, is a bump that forms on an eyelid. It may look like a pimple next to the eyelash. A stye can form inside the eyelid (internal stye) or outside the eyelid (external stye). A stye can cause redness, swelling, and pain on the eyelid. Styes are very common. Anyone can get them at any age. They usually occur in just one eye, but you may have more than one in either eye. What are the causes?  A stye is caused by an infection. The infection is almost always caused by bacteria called Staphylococcus aureus. This is a common type of bacteria that lives on the skin. An internal stye may result from an infected oil-producing gland inside the eyelid. An external stye may be caused by an infection at the base of the eyelash (hair follicle). What increases the risk? You are more likely to develop a stye if:  You have had a stye before.  You have any of these conditions: ? Diabetes. ? Red, itchy, inflamed eyelids (blepharitis). ? A skin condition such as seborrheic dermatitis or rosacea. ? High fat levels in your blood (lipids). What are the signs or symptoms? The most common symptom of a stye is eyelid pain. Internal styes are more painful than external styes. Other symptoms may include:  Painful swelling of your eyelid.  A scratchy feeling in your eye.  Tearing and redness of your eye.  Pus draining from the stye. How is this diagnosed? Your health care provider may be able to diagnose a stye just by examining your eye. The health care provider may also check to make sure:  You do not have a fever  or other signs of a more serious infection.  The infection has not spread to other parts of your eye or areas around your eye. How is this treated? Most styes will clear up in a few days without treatment or with warm compresses applied to the area. You may need to use antibiotic drops or ointment to treat an infection. In some cases, if your stye does not heal with routine treatment, your health care provider may drain pus from the stye using a thin blade or needle. This may be done if the stye is large, causing a lot of pain, or affecting your vision. Follow these instructions at home:  Take over-the-counter and prescription medicines only as told by your health care provider. This includes eye drops or ointments.  If you were prescribed an antibiotic medicine, apply or use it as told by your health care provider. Do not stop using the antibiotic even if your condition improves.  Apply a warm, wet cloth (warm compress) to your eye for 5-10 minutes, 4 times a day.  Clean the affected eyelid as directed by your health care provider.  Do not wear contact lenses or eye makeup until your stye has healed.  Do not try to pop or drain the stye.  Do not rub your eye. Contact a health care provider if:  You have chills or a fever.  Your stye does not go away after several days.  Your stye affects your vision.  Your eyeball becomes swollen, red, or painful. Get help right away if:  You have pain when moving your eye around. Summary  A stye is a bump that forms on an eyelid. It may look like a pimple next to the eyelash.  A stye can form inside the eyelid (internal stye) or outside the eyelid (external stye). A stye can cause redness, swelling, and pain on the eyelid.  Your health care provider may be able to diagnose a stye just by examining your eye.  Apply a warm, wet cloth (warm compress) to your eye for 5-10 minutes, 4 times a day. This information is not intended to replace  advice given to you by your health care provider. Make sure you discuss any questions you have with your health care provider. Document Released: 08/13/2005 Document Revised: 07/16/2017 Document Reviewed:  07/16/2017 Elsevier Interactive Patient Education  [employer]2019 Elsevier Inc.

## 2018-12-10 NOTE — ED Notes (Signed)
Pt's Wife's contact information:  James Good [phone]
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
## 2019-02-18 ENCOUNTER — Ambulatory Visit: Payer: Managed Care, Other (non HMO) | Admitting: Neurology

## 2019-07-18 ENCOUNTER — Emergency Department (HOSPITAL_COMMUNITY)
Admission: EM | Admit: 2019-07-18 | Discharge: 2019-07-19 | Disposition: A | Discharge status: Home / Self Care | Payer: Managed Care, Other (non HMO) | Attending: Emergency Medicine | Admitting: Emergency Medicine

## 2019-07-18 ENCOUNTER — Encounter (HOSPITAL_COMMUNITY): Payer: Self-pay | Admitting: Emergency Medicine

## 2019-07-18 ENCOUNTER — Other Ambulatory Visit: Payer: Self-pay

## 2019-07-18 ENCOUNTER — Emergency Department (HOSPITAL_COMMUNITY): Payer: Managed Care, Other (non HMO)

## 2019-07-18 DIAGNOSIS — R419 Unspecified symptoms and signs involving cognitive functions and awareness: Secondary | ICD-10-CM | POA: Insufficient documentation

## 2019-07-18 DIAGNOSIS — R55 Syncope and collapse: Secondary | ICD-10-CM | POA: Diagnosis not present

## 2019-07-18 DIAGNOSIS — F1510 Other stimulant abuse, uncomplicated: Secondary | ICD-10-CM | POA: Diagnosis not present

## 2019-07-18 DIAGNOSIS — R5383 Other fatigue: Secondary | ICD-10-CM | POA: Insufficient documentation

## 2019-07-18 DIAGNOSIS — I10 Essential (primary) hypertension: Secondary | ICD-10-CM | POA: Insufficient documentation

## 2019-07-18 DIAGNOSIS — Z79899 Other long term (current) drug therapy: Secondary | ICD-10-CM | POA: Insufficient documentation

## 2019-07-18 DIAGNOSIS — R4189 Other symptoms and signs involving cognitive functions and awareness: Secondary | ICD-10-CM | POA: Diagnosis present

## 2019-07-18 LAB — CBC
HCT: 44.9 % (ref 39.0–52.0)
Hemoglobin: 14.9 g/dL (ref 13.0–17.0)
MCH: 32 pg (ref 26.0–34.0)
MCHC: 33.2 g/dL (ref 30.0–36.0)
MCV: 96.4 fL (ref 80.0–100.0)
Platelets: 270 10*3/uL (ref 150–400)
RBC: 4.66 MIL/uL (ref 4.22–5.81)
RDW: 13.5 % (ref 11.5–15.5)
WBC: 8.4 10*3/uL (ref 4.0–10.5)
nRBC: 0 % (ref 0.0–0.2)

## 2019-07-18 LAB — URINALYSIS, ROUTINE W REFLEX MICROSCOPIC
Bacteria, UA: NONE SEEN
Bilirubin Urine: NEGATIVE
Glucose, UA: NEGATIVE mg/dL
Hgb urine dipstick: NEGATIVE
Ketones, ur: NEGATIVE mg/dL
Leukocytes,Ua: NEGATIVE
Nitrite: NEGATIVE
Protein, ur: 30 mg/dL — AB
Specific Gravity, Urine: 1.019 (ref 1.005–1.030)
pH: 6 (ref 5.0–8.0)

## 2019-07-18 LAB — BASIC METABOLIC PANEL
Anion gap: 12 (ref 5–15)
BUN: <5 mg/dL — ABNORMAL LOW (ref 6–20)
CO2: 23 mmol/L (ref 22–32)
Calcium: 9.3 mg/dL (ref 8.9–10.3)
Chloride: 101 mmol/L (ref 98–111)
Creatinine, Ser: 1.12 mg/dL (ref 0.61–1.24)
GFR calc Af Amer: >60 mL/min (ref >60)
GFR calc non Af Amer: >60 mL/min (ref >60)
Glucose, Bld: 111 mg/dL — ABNORMAL HIGH (ref 70–99)
Potassium: 3.3 mmol/L — ABNORMAL LOW (ref 3.5–5.1)
Sodium: 136 mmol/L (ref 135–145)

## 2019-07-18 MED ORDER — SODIUM CHLORIDE 0.9% FLUSH: 3.0000 mL | Freq: Once | INTRAVENOUS | Status: DC
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
## 2019-07-19 NOTE — Discharge Instructions (Signed)
No driving until seen and cleared by neurology.  I have placed a referral but if you do not hear from them within the next 24-48 hours I would call their office to follow-up. Return here for any new/acute changes-- chest pain, shortness of breath, seizure activity, etc.
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
## 2019-07-19 NOTE — ED Notes (Signed)
Patient verbalizes understanding of discharge instructions. Opportunity for questioning and answers were provided. Armband removed by staff, pt discharged from ED.  

## 2019-08-31 ENCOUNTER — Other Ambulatory Visit: Payer: Self-pay

## 2019-08-31 ENCOUNTER — Encounter: Payer: Self-pay | Admitting: Neurology

## 2019-08-31 ENCOUNTER — Ambulatory Visit: Payer: Managed Care, Other (non HMO) | Admitting: Neurology

## 2019-08-31 ENCOUNTER — Telehealth: Payer: Self-pay | Admitting: Neurology

## 2019-08-31 VITALS — BP 140/89 | HR 88 | Temp 98.0°F | Ht 68.0 in | Wt 185.0 lb

## 2019-08-31 DIAGNOSIS — G40909 Epilepsy, unspecified, not intractable, without status epilepticus: Secondary | ICD-10-CM

## 2019-08-31 DIAGNOSIS — G479 Sleep disorder, unspecified: Secondary | ICD-10-CM | POA: Diagnosis not present

## 2019-08-31 MED ORDER — DIVALPROEX SODIUM ER 500 MG PO TB24: ORAL_TABLET | ORAL | 3 refills | Status: DC

## 2019-08-31 NOTE — Telephone Encounter (Signed)
[employer] order sent to GI. They will obtain the auth and reach out to the patient to schedule.  

## 2019-08-31 NOTE — Patient Instructions (Signed)
We will start depakote for seizures and headache.   Depakote (valproic acid) is a seizure medication that also has an FDA approval for migraine headache. The most common potential side effects of this medication include weight gain, tremor, or possible stomach upset. This medication can potentially cause liver problems. If confusion is noted on this medication, contact our office immediately.
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
## 2019-09-06 ENCOUNTER — Telehealth: Payer: Self-pay | Admitting: Neurology

## 2019-09-06 MED ORDER — LEVETIRACETAM 500 MG PO TABS: ORAL_TABLET | ORAL | 3 refills | Status: DC

## 2019-09-06 NOTE — Telephone Encounter (Signed)
I called the patient.  The patient is not able to tolerate the Depakote, he has sweats and dizziness.  He will stop the medication, will switch to Keppra 500 mg tablets.

## 2019-09-06 NOTE — Telephone Encounter (Signed)
I reached out to pt's wife ( ok per dpr) She reports since the pt started the Depakote 500 mg on 08/31/2019 the pt has been dizzy, nauseated, diaphoretic along with increased tiredness.      Pt's wife states the pt's amlodipine 10 mg has been decreased to 5 mg but other than this change meds have been the same.

## 2019-09-06 NOTE — Telephone Encounter (Signed)
Pt's wife called stating that the pt is having side effects since he started the divalproex (DEPAKOTE ER) 500 MG 24 hr tablet He is having chills, breaking out in sweat and dizziness. Please advise.

## 2019-09-07 ENCOUNTER — Encounter: Payer: Self-pay | Admitting: Neurology

## 2019-09-07 ENCOUNTER — Ambulatory Visit: Payer: Managed Care, Other (non HMO) | Admitting: Neurology

## 2019-09-07 ENCOUNTER — Other Ambulatory Visit: Payer: Self-pay

## 2019-09-07 VITALS — BP 129/77 | HR 67 | Temp 97.9°F | Ht 67.0 in | Wt 187.0 lb

## 2019-09-07 DIAGNOSIS — R0681 Apnea, not elsewhere classified: Secondary | ICD-10-CM | POA: Diagnosis not present

## 2019-09-07 DIAGNOSIS — R351 Nocturia: Secondary | ICD-10-CM

## 2019-09-07 DIAGNOSIS — R0683 Snoring: Secondary | ICD-10-CM

## 2019-09-07 DIAGNOSIS — E663 Overweight: Secondary | ICD-10-CM

## 2019-09-07 DIAGNOSIS — F17200 Nicotine dependence, unspecified, uncomplicated: Secondary | ICD-10-CM

## 2019-09-07 DIAGNOSIS — G40909 Epilepsy, unspecified, not intractable, without status epilepticus: Secondary | ICD-10-CM | POA: Diagnosis not present

## 2019-09-07 DIAGNOSIS — Z820 Family history of epilepsy and other diseases of the nervous system: Secondary | ICD-10-CM

## 2019-09-07 DIAGNOSIS — R519 Headache, unspecified: Secondary | ICD-10-CM

## 2019-09-07 MED ORDER — LEVETIRACETAM 500 MG PO TABS: ORAL_TABLET | ORAL | 3 refills | Status: DC

## 2019-09-07 NOTE — Patient Instructions (Signed)

## 2019-09-07 NOTE — Addendum Note (Signed)
Addended by: Lester Fostoria A on: 09/07/2019 09:07 AM   Modules accepted: Orders
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
## 2019-09-13 ENCOUNTER — Telehealth: Payer: Self-pay

## 2019-09-13 ENCOUNTER — Telehealth: Payer: Self-pay | Admitting: *Deleted

## 2019-09-13 NOTE — Telephone Encounter (Signed)
Attending Physician's Statement has been completed and signed by Dr. Jannifer Franklin. Forms have been fwd back to medical records for processing.

## 2019-09-13 NOTE — Telephone Encounter (Signed)
Pt [place] form faxed on 09/13/19 to [phone].

## 2019-09-19 ENCOUNTER — Ambulatory Visit: Payer: Managed Care, Other (non HMO)

## 2019-09-19 ENCOUNTER — Other Ambulatory Visit: Payer: Self-pay

## 2019-09-19 DIAGNOSIS — G40909 Epilepsy, unspecified, not intractable, without status epilepticus: Secondary | ICD-10-CM

## 2019-09-19 DIAGNOSIS — G479 Sleep disorder, unspecified: Secondary | ICD-10-CM

## 2019-09-19 NOTE — Telephone Encounter (Signed)
James Good: [id number] (exp. 09/15/19 to 03/13/20) patient is scheduled at GI for 09/20/19.

## 2019-09-20 ENCOUNTER — Other Ambulatory Visit: Payer: Self-pay

## 2019-09-20 ENCOUNTER — Ambulatory Visit
Admission: RE | Admit: 2019-09-20 | Discharge: 2019-09-20 | Disposition: A | Discharge status: Home / Self Care | Payer: Managed Care, Other (non HMO) | Source: Ambulatory Visit | Attending: Neurology | Admitting: Neurology

## 2019-09-20 DIAGNOSIS — G40909 Epilepsy, unspecified, not intractable, without status epilepticus: Secondary | ICD-10-CM

## 2019-09-20 DIAGNOSIS — G479 Sleep disorder, unspecified: Secondary | ICD-10-CM

## 2019-09-20 MED ORDER — GADOBENATE DIMEGLUMINE 529 MG/ML IV SOLN
17.0000 mL | Freq: Once | INTRAVENOUS | Status: AC | PRN
  Administered 2019-09-20: 17 mL via INTRAVENOUS

## 2019-09-21 ENCOUNTER — Ambulatory Visit (INDEPENDENT_AMBULATORY_CARE_PROVIDER_SITE_OTHER): Payer: Managed Care, Other (non HMO) | Admitting: Neurology

## 2019-09-21 DIAGNOSIS — R0683 Snoring: Secondary | ICD-10-CM

## 2019-09-21 DIAGNOSIS — G40909 Epilepsy, unspecified, not intractable, without status epilepticus: Secondary | ICD-10-CM

## 2019-09-21 DIAGNOSIS — E663 Overweight: Secondary | ICD-10-CM

## 2019-09-21 DIAGNOSIS — R0681 Apnea, not elsewhere classified: Secondary | ICD-10-CM

## 2019-09-21 DIAGNOSIS — Z820 Family history of epilepsy and other diseases of the nervous system: Secondary | ICD-10-CM

## 2019-09-21 DIAGNOSIS — G4733 Obstructive sleep apnea (adult) (pediatric): Secondary | ICD-10-CM

## 2019-09-21 DIAGNOSIS — R519 Headache, unspecified: Secondary | ICD-10-CM

## 2019-09-21 DIAGNOSIS — R351 Nocturia: Secondary | ICD-10-CM

## 2019-09-21 DIAGNOSIS — F17200 Nicotine dependence, unspecified, uncomplicated: Secondary | ICD-10-CM

## 2019-09-24 ENCOUNTER — Telehealth: Payer: Self-pay | Admitting: Neurology

## 2019-09-24 NOTE — Telephone Encounter (Signed)
I called the patient.  MRI of the brain was normal, EEG results are pending.   MRI brain 09/20/19:  IMPRESSION:   Normal MRI brain (with and without).

## 2019-09-26 ENCOUNTER — Telehealth: Payer: Self-pay | Admitting: Neurology

## 2019-09-26 NOTE — Telephone Encounter (Signed)
Pt is asking for another call from Dr Jannifer Franklin to discuss the levETIRAcetam (KEPPRA) 500 MG tablet
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
## 2019-09-26 NOTE — Telephone Encounter (Signed)
I called the patient.  EEG study was normal.  The patient will remain on Keppra, he will inform the if he has another seizure event.

## 2019-09-26 NOTE — Telephone Encounter (Signed)
I called the patient.  The patient read that the [place] was for epilepsy, indicated that he is taking the medication for seizure disorder, he is to stay on the medication.

## 2019-10-05 ENCOUNTER — Telehealth: Payer: Self-pay

## 2019-10-05 NOTE — Telephone Encounter (Signed)
-----   Message from Star Age, MD sent at 10/05/2019  8:06 AM EST ----- Patient referred by Dr. Jannifer Franklin, seen by me on 09/07/19, HST on 09/22/19.    Please call and notify the patient that the recent home sleep test showed obstructive sleep apnea in the moderate range. While I recommend treatment for this in the form CPAP, his insurance will not approve a sleep study for this. They will likely only approve a trial of autoPAP, which means, that we don't have to bring him in for a sleep study with CPAP, but will let him start using a so called autoPAP machine at home, through a DME company (of his choice, or as per insurance requirement). The DME representative will educate him on how to use the machine, how to put the mask on, etc. I have placed an order in the chart. Please send referral, talk to patient, send report to referring MD. We will need a FU in sleep clinic for 10 weeks post-PAP set up, please arrange that with me or one of our NPs. Thanks,   Star Age, MD, PhD [facility])

## 2019-10-05 NOTE — Procedures (Signed)
Sleep Study Report   Patient Information     First Name: James Last Name: Good ID: [id number]  Birth Date: 26-Jun-1976 Age: 43 Gender: Male  Referring Provider: Dr. Margette Fast BMI: 29.4 (W=187 lb, H=5' 7'')  Neck Circ.:  17.5"    Sleep Study Information    Study Date: Sep 22, 2019 S/H/A Version: 001.001.001.001 / 4.1.1528 / 30  History:    43 year old man with a history of hypertension, anxiety, recurrent headaches, seizure disorder and overweight state, who reports snoring and sleep disruption.  Summary & Diagnosis:    OSA  Recommendations:      This home sleep test demonstrates moderate obstructive sleep apnea with a total AHI of 15/hour and O2 nadir of 84%. Snoring appeared to be in the mild to moderate range. Given the patient's medical history and sleep related complaints, treatment with positive airway pressure (in the form of CPAP) is recommended. This will require a full night CPAP titration study for proper treatment settings, O2 monitoring and mask fitting. Based on the severity of the sleep disordered breathing an attended titration study is indicated. However, patient's insurance has denied an attended sleep study; therefore, the patient will be advised to proceed with an autoPAP titration/trial at home for now. Please note that untreated obstructive sleep apnea may carry additional perioperative morbidity. Patients with significant obstructive sleep apnea should receive perioperative PAP therapy and the surgeons and particularly the anesthesiologist should be informed of the diagnosis and the severity of the sleep disordered breathing. The patient should be cautioned not to drive, work at heights, or operate dangerous or heavy equipment when tired or sleepy. Review and reiteration of good sleep hygiene measures should be pursued with any patient. Other causes of the patient's symptoms, including circadian rhythm disturbances, an underlying mood disorder, medication effect  and/or an underlying medical problem cannot be ruled out based on this test. Clinical correlation is recommended. The patient and his referring provider will be notified of the test results. The patient will be seen in follow up in sleep clinic at [facility].  I certify that I have reviewed the raw data recording prior to the issuance of this report in accordance with the standards of the American Academy of Sleep Medicine (AASM).  Star Age, MD, PhD [facility]) Diplomat, ABPN (Neurology and Sleep)            Sleep Summary  Oxygen Saturation Statistics   Start Study Time: End Study Time: Total Recording Time:    3:29:24 AM 11:37:05 AM   8 h, 7 min  Total Sleep Time % REM of Sleep Time:  7 h, 18 min  33.9    Mean: 96 Minimum: 84 Maximum: 100  Mean of Desaturations Nadirs (%):   92  Oxygen Desaturation. %:   4-9 10-20 >20 Total  Events Number Total    55  3 94.8 5.2  0 0.0  58 100.0  Oxygen Saturation: <90 <=88 <85 <80 <70  Duration (minutes): Sleep % 0.6 0.1  0.3 0.0  0.1 0.0 0.0 0.0 0.0 0.0     Respiratory Indices      Total Events REM NREM All Night  pRDI:  127  pAHI:  107 ODI:  58  pAHIc:  2  % CSR: 0.0 25.4 21.6 13.6 0.0 14.1 11.8 5.5 0.5 17.9 15.0 8.2 0.3       Pulse Rate Statistics during Sleep (BPM)      Mean: 63 Minimum: 43  Maximum: 110    Sleep Study Report   Indices are calculated using technically valid sleep time of  7 hrs, 7 min. Central-Indices are calculated using technically valid sleep time of  6  hrs, 30 min. pRDI/pAHI are calculated using oxi desaturations = 3%   Body Position Statistics  Position Supine Prone Right Left Non-Supine  Sleep (min) 201.5 109.7 93.5 33.5 236.7  Sleep % 46.0 25.0 21.3 7.6 54.0  pRDI 28.4 13.8 4.5 5.4 8.9  pAHI 27.1 6.9 3.2 1.8 4.7  ODI 16.8 1.2 0.7 0.0 0.8               Left   Prone  Right  Supine    Snoring Statistics Snoring Level (dB) >40 >50  >60 >70 >80 >Threshold (45)  Sleep (min) 212.7 15.1 4.0 0.4 0.0 37.7  Sleep % 48.5 3.4 0.9 0.1 0.0 8.6    Mean: 42 dB Sleep Study Report   Sleep Stages Chart                         Wake  Sleep      Wake  10.15  %    Sleep  89.85  %   Total:  %  100.00                                                             REM  Light  Deep      REM  %  33.89    Light  %  48.08    Deep  %  18.03   Total:  100.00  %                                 Sleep/Wake States  Sleep Stages  Sleep Latency (min):  REM Latency (min):  Number of Wakes:   25   69   7                        pAHI=15.0                                    Mild              Moderate                    Severe                                                 5              15                    30

## 2019-10-05 NOTE — Addendum Note (Signed)
Addended by: Star Age on: 10/05/2019 08:06 AM   Modules accepted: Orders

## 2019-10-05 NOTE — Progress Notes (Signed)
Patient referred by Dr. Jannifer Franklin, seen by me on 09/07/19, HST on 09/22/19.    Please call and notify the patient that the recent home sleep test showed obstructive sleep apnea in the moderate range. While I recommend treatment for this in the form CPAP, his insurance will not approve a sleep study for this. They will likely only approve a trial of autoPAP, which means, that we don't have to bring him in for a sleep study with CPAP, but will let him start using a so called autoPAP machine at home, through a DME company (of his choice, or as per insurance requirement). The DME representative will educate him on how to use the machine, how to put the mask on, etc. I have placed an order in the chart. Please send referral, talk to patient, send report to referring MD. We will need a FU in sleep clinic for 10 weeks post-PAP set up, please arrange that with me or one of our NPs. Thanks,   Star Age, MD, PhD [facility])

## 2019-10-05 NOTE — Telephone Encounter (Signed)
I called pt. I advised pt that Dr. Rexene Alberts reviewed their sleep study results and found that pt has moderate osa. Dr. Rexene Alberts recommends that pt start an auto pap at home. I reviewed PAP compliance expectations with the pt. Pt is agreeable to starting an auto-PAP. I advised pt that an order will be sent to a DME, [facility], and [facility] will call the pt within about one week after they file with the pt's insurance. [facility] will show the pt how to use the machine, fit for masks, and troubleshoot the auto-PAP if needed. A follow up appt was made for insurance purposes with Amy, NP on 12/08/19 at 2:00pm. We cancelled the appt on 12/01/19 with Amy, NP; pt requested a 2 pm appt. Pt verbalized understanding to arrive 15 minutes early and bring their auto-PAP. A letter with all of this information in it will be mailed to the pt as a reminder. I verified with the pt that the address we have on file is correct. Pt verbalized understanding of results. Pt had no questions at this time but was encouraged to call back if questions arise. I have sent the order to [facility] and have received confirmation that they have received the order.

## 2019-12-01 ENCOUNTER — Ambulatory Visit: Payer: Managed Care, Other (non HMO) | Admitting: Family Medicine

## 2019-12-08 ENCOUNTER — Encounter: Payer: Self-pay | Admitting: Family Medicine

## 2019-12-08 ENCOUNTER — Ambulatory Visit: Payer: Managed Care, Other (non HMO) | Admitting: Family Medicine

## 2019-12-08 ENCOUNTER — Telehealth: Payer: Self-pay | Admitting: Family Medicine

## 2019-12-08 ENCOUNTER — Other Ambulatory Visit: Payer: Self-pay

## 2019-12-08 VITALS — BP 160/90 | HR 68 | Temp 97.7°F | Ht 68.0 in | Wt 188.0 lb

## 2019-12-08 DIAGNOSIS — Z9989 Dependence on other enabling machines and devices: Secondary | ICD-10-CM

## 2019-12-08 DIAGNOSIS — G4733 Obstructive sleep apnea (adult) (pediatric): Secondary | ICD-10-CM

## 2019-12-08 DIAGNOSIS — G40909 Epilepsy, unspecified, not intractable, without status epilepticus: Secondary | ICD-10-CM
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
## 2019-12-08 NOTE — Patient Instructions (Signed)
Continue CPAP nightly and greater than 4 hours each night.   Continue levetiracetam 500mg  twice daily, call for any worsening side effects  Follow up in 6 months, sooner if needed   Seizure, Adult A seizure is a sudden burst of abnormal electrical activity in the brain. Seizures usually last from 30 seconds to 2 minutes. They can cause many different symptoms. Usually, seizures are not harmful unless they last a long time. What are the causes? Common causes of this condition include:  Fever or infection.  Conditions that affect the brain, such as: ? A brain abnormality that you were born with. ? A brain or head injury. ? Bleeding in the brain. ? A tumor. ? Stroke. ? Brain disorders such as autism or cerebral palsy.  Low blood sugar.  Conditions that are passed from parent to child (are inherited).  Problems with substances, such as: ? Having a reaction to a drug or a medicine. ? Suddenly stopping the use of a substance (withdrawal). In some cases, the cause may not be known. A person who has repeated seizures over time without a clear cause has a condition called epilepsy. What increases the risk? You are more likely to get this condition if you have:  A family history of epilepsy.  Had a seizure in the past.  A brain disorder.  A history of head injury, lack of oxygen at birth, or strokes. What are the signs or symptoms? There are many types of seizures. The symptoms vary depending on the type of seizure you have. Examples of symptoms during a seizure include:  Shaking (convulsions).  Stiffness in the body.  Passing out (losing consciousness).  Head nodding.  Staring.  Not responding to sound or touch.  Loss of bladder control and bowel control. Some people have symptoms right before and right after a seizure happens. Symptoms before a seizure may include:  Fear.  Worry (anxiety).  Feeling like you may vomit (nauseous).  Feeling like the room is  spinning (vertigo).  Feeling like you saw or heard something before (dj vu).  Odd tastes or smells.  Changes in how you see. You may see flashing lights or spots. Symptoms after a seizure happens can include:  Confusion.  Sleepiness.  Headache.  Weakness on one side of the body. How is this treated? Most seizures will stop on their own in under 5 minutes. In these cases, no treatment is needed. Seizures that last longer than 5 minutes will usually need treatment. Treatment can include:  Medicines given through an IV tube.  Avoiding things that are known to cause your seizures. These can include medicines that you take for another condition.  Medicines to treat epilepsy.  Surgery to stop the seizures. This may be needed if medicines do not help. Follow these instructions at home: Medicines  Take over-the-counter and prescription medicines only as told by your doctor.  Do not eat or drink anything that may keep your medicine from working, such as alcohol. Activity  Do not do any activities that would be dangerous if you had another seizure, like driving or swimming. Wait until your doctor says it is safe for you to do them.  If you live in the [place] ask your local DMV (department of motor vehicles) when you can drive.  Get plenty of rest. Teaching others Teach friends and family what to do when you have a seizure. They should:  Lay you on the ground.  Protect your head and body.  Loosen any  tight clothing around your neck.  Turn you on your side.  Not hold you down.  Not put anything into your mouth.  Know whether or not you need emergency care.  Stay with you until you are better.  General instructions  Contact your doctor each time you have a seizure.  Avoid anything that gives you seizures.  Keep a seizure diary. Write down: ? What you think caused each seizure. ? What you remember about each seizure.  Keep all follow-up visits as told by your  doctor. This is important. Contact a doctor if:  You have another seizure.  You have seizures more often.  There is any change in what happens during your seizures.  You keep having seizures with treatment.  You have symptoms of being sick or having an infection. Get help right away if:  You have a seizure that: ? Lasts longer than 5 minutes. ? Is different than seizures you had before. ? Makes it harder to breathe. ? Happens after you hurt your head.  You have any of these symptoms after a seizure: ? Not being able to speak. ? Not being able to use a part of your body. ? Confusion. ? A bad headache.  You have two or more seizures in a row.  You do not wake up right after a seizure.  You get hurt during a seizure. These symptoms may be an emergency. Do not wait to see if the symptoms will go away. Get medical help right away. Call your local emergency services (911 in the [place] Do not drive yourself to the hospital. Summary  Seizures usually last from 30 seconds to 2 minutes. Usually, they are not harmful unless they last a long time.  Do not eat or drink anything that may keep your medicine from working, such as alcohol.  Teach friends and family what to do when you have a seizure.  Contact your doctor each time you have a seizure. This information is not intended to replace advice given to you by your health care provider. Make sure you discuss any questions you have with your health care provider. Document Revised: 01/21/2019 Document Reviewed: 01/21/2019 Elsevier Patient Education  2020 Elsevier Inc.    Sleep Apnea Sleep apnea affects breathing during sleep. It causes breathing to stop for a short time or to become shallow. It can also increase the risk of:  Heart attack.  Stroke.  Being very overweight (obese).  Diabetes.  Heart failure.  Irregular heartbeat. The goal of treatment is to help you breathe normally again. What are the causes? There  are three kinds of sleep apnea:  Obstructive sleep apnea. This is caused by a blocked or collapsed airway.  Central sleep apnea. This happens when the brain does not send the right signals to the muscles that control breathing.  Mixed sleep apnea. This is a combination of obstructive and central sleep apnea. The most common cause of this condition is a collapsed or blocked airway. This can happen if:  Your throat muscles are too relaxed.  Your tongue and tonsils are too large.  You are overweight.  Your airway is too small. What increases the risk?  Being overweight.  Smoking.  Having a small airway.  Being older.  Being male.  Drinking alcohol.  Taking medicines to calm yourself (sedatives or tranquilizers).  Having family members with the condition. What are the signs or symptoms?  Trouble staying asleep.  Being sleepy or tired during the day.  Getting  angry a lot.  Loud snoring.  Headaches in the morning.  Not being able to focus your mind (concentrate).  Forgetting things.  Less interest in sex.  Mood swings.  Personality changes.  Feelings of sadness (depression).  Waking up a lot during the night to pee (urinate).  Dry mouth.  Sore throat. How is this diagnosed?  Your medical history.  A physical exam.  A test that is done when you are sleeping (sleep study). The test is most often done in a sleep lab but may also be done at home. How is this treated?   Sleeping on your side.  Using a medicine to get rid of mucus in your nose (decongestant).  Avoiding the use of alcohol, medicines to help you relax, or certain pain medicines (narcotics).  Losing weight, if needed.  Changing your diet.  Not smoking.  Using a machine to open your airway while you sleep, such as: ? An oral appliance. This is a mouthpiece that shifts your lower jaw forward. ? A CPAP device. This device blows air through a mask when you breathe out (exhale). ? An  EPAP device. This has valves that you put in each nostril. ? A BPAP device. This device blows air through a mask when you breathe in (inhale) and breathe out.  Having surgery if other treatments do not work. It is important to get treatment for sleep apnea. Without treatment, it can lead to:  High blood pressure.  Coronary artery disease.  In men, not being able to have an erection (impotence).  Reduced thinking ability. Follow these instructions at home: Lifestyle  Make changes that your doctor recommends.  Eat a healthy diet.  Lose weight if needed.  Avoid alcohol, medicines to help you relax, and some pain medicines.  Do not use any products that contain nicotine or tobacco, such as cigarettes, e-cigarettes, and chewing tobacco. If you need help quitting, ask your doctor. General instructions  Take over-the-counter and prescription medicines only as told by your doctor.  If you were given a machine to use while you sleep, use it only as told by your doctor.  If you are having surgery, make sure to tell your doctor you have sleep apnea. You may need to bring your device with you.  Keep all follow-up visits as told by your doctor. This is important. Contact a doctor if:  The machine that you were given to use during sleep bothers you or does not seem to be working.  You do not get better.  You get worse. Get help right away if:  Your chest hurts.  You have trouble breathing in enough air.  You have an uncomfortable feeling in your back, arms, or stomach.  You have trouble talking.  One side of your body feels weak.  A part of your face is hanging down. These symptoms may be an emergency. Do not wait to see if the symptoms will go away. Get medical help right away. Call your local emergency services (911 in the [place] Do not drive yourself to the hospital. Summary  This condition affects breathing during sleep.  The most common cause is a collapsed or  blocked airway.  The goal of treatment is to help you breathe normally while you sleep. This information is not intended to replace advice given to you by your health care provider. Make sure you discuss any questions you have with your health care provider. Document Revised: 08/20/2018 Document Reviewed: 06/29/2018 Elsevier Patient Education  2020 Elsevier Inc.  
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
## 2019-12-08 NOTE — Progress Notes (Signed)
I have read the note, and I agree with the clinical assessment and plan.  Kyri K Mahogany [place]   

## 2019-12-12 ENCOUNTER — Encounter: Payer: Self-pay | Admitting: *Deleted

## 2019-12-12 NOTE — Progress Notes (Signed)
Letter place in AL/NP inbox.

## 2019-12-12 NOTE — Telephone Encounter (Signed)
I called and relayed to pt in VM that restrictions for driving are still in place from 6 months from last sz (01/16/2020).  If a letter is needed let me know. (this per AL/NP and CW/MD).

## 2019-12-12 NOTE — Telephone Encounter (Signed)
I called  And spoke to wife when pt was asleep.  I relayed per AL/NP that she was ok on doing not for work.  MR to call when able to pick up. She verbalized understanding.

## 2019-12-12 NOTE — Telephone Encounter (Signed)
Yes sir Thank you for your help. James Good, please let patient know that driving restrictions are in place until March 1. We can provide a letter to his employer if needed. TY!

## 2019-12-12 NOTE — Telephone Encounter (Signed)
Patient returned call and stated he will need a letter of documentation and he is requesting a call back

## 2019-12-13 NOTE — Telephone Encounter (Signed)
Pt has called asking that it be documented that he will come by office for the letter.  Pt also mentioned he has paperwork for long term disability that he wants Dr Anne Hahn to complete.  Pt was advised once the fee is paid the paperwork would then be forwarded to the provider for completion. Pt was informed the fee is $50.00 pt expressed his disbelief in having to pay such an amount for the completing of paperwork, and because he was just in the office last week to see a NP.  Pt went on to say that Dr Anne Hahn knows his family and he will see about having to pay such an absurd amount of money.  This is FYI, he has not asked for a call back

## 2019-12-13 NOTE — Telephone Encounter (Signed)
Noted.  $50.00 fee is set by practice.

## 2019-12-13 NOTE — Telephone Encounter (Signed)
Letter signed, to MR.

## 2019-12-29 ENCOUNTER — Telehealth: Payer: Self-pay

## 2019-12-29 NOTE — Telephone Encounter (Signed)
I called pt. He states that he feels that he continues with grogginess, fatigue while using cpap machine greater then 4 hours He feels that keppra is causing SE and issues with Bp, He did connect with [facility] pharmacist) at drug interactions. . He states that he has been in touch with his pcp about his Bp elevations 180 sys 162/110, 151/105 (could have panic attack) after he had R sided numbness (lasted 2 hours) this am that could have raised bp.  I told him that if numbness if one sided (and Bp elevated) could be stroke. (seek care at ED, not call doctors office).  Please respond about keppra issue.  He feels he is having SE from this and ? Change med?  I told him Dr Anne Hahn out of office this week, can send message, will respond next week.

## 2019-12-29 NOTE — Telephone Encounter (Signed)
Patient called requesting a call back from Dr.Willis in regards to his medications losartan, amlodipine, and keppra   Patient called stating he has been having side effects for keppra.

## 2019-12-29 NOTE — Telephone Encounter (Signed)
Please let him know that I have reviewed his chart. I know that he has had trouble with elevated blood pressures as well as previous episodes of numbness of extremities in the past. I do not feel these symptoms are side effects from levetiracetam. We can switch him to a different medication but all have side effects. I think it would be best for him to come into the office for [place] to discuss different options. I know there is mention of him taking divalproex in the past but I do not see any reported side effects. Does he remember taking this? With medication changes, driving restrictions would need to be in place. Please get him on the schedule for a follow up to discuss.

## 2019-12-29 NOTE — Telephone Encounter (Signed)
I called pt and made appt for him 01-03-20 at 1030 arrive 1000.  Med management for side effects of keppra.  He stated he almost fell out in [facility] when on depakote.

## 2020-01-01 NOTE — Telephone Encounter (Signed)
Certainly, Keppra can cause drowsiness and it can worsen anxiety.  If this seems to be an issue for the patient, we may consider switching over to Lamictal, particularly if Depakote was not tolerated in the past.
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
## 2020-01-03 ENCOUNTER — Ambulatory Visit: Payer: Managed Care, Other (non HMO) | Admitting: Family Medicine

## 2020-01-03 ENCOUNTER — Other Ambulatory Visit: Payer: Self-pay

## 2020-01-03 ENCOUNTER — Encounter: Payer: Self-pay | Admitting: *Deleted

## 2020-01-03 ENCOUNTER — Encounter: Payer: Self-pay | Admitting: Family Medicine

## 2020-01-03 VITALS — BP 127/81 | HR 91 | Temp 97.5°F | Ht 68.0 in | Wt 185.4 lb

## 2020-01-03 DIAGNOSIS — G4733 Obstructive sleep apnea (adult) (pediatric): Secondary | ICD-10-CM

## 2020-01-03 DIAGNOSIS — Z9989 Dependence on other enabling machines and devices: Secondary | ICD-10-CM

## 2020-01-03 DIAGNOSIS — G40909 Epilepsy, unspecified, not intractable, without status epilepticus: Secondary | ICD-10-CM

## 2020-01-03 MED ORDER — LAMOTRIGINE 25 MG PO TABS: ORAL_TABLET | ORAL | 1 refills | Status: DC

## 2020-01-03 NOTE — Patient Instructions (Addendum)
Continue levetiracetam for now. We will start lamotrigine as directed. Will plan to wean levetiracetam when we get lamotrigine dose to goal.   Continue follow up with PCP regarding BP  Follow up with Dr Anne Hahn in 4 weeks    Start Lamictal 25 mg Take 1 tablet twice a day ( 1 tablet the morning and 1 tablet at night) for 1 week then increase to: 2 tablets twice a day ( 2 tablet the morning and 2 tablet at night) for 1 week then increase to: 3 tablets twice a day ( 3 tablets in the morning and 3 tablets at night) for 1 week then increase to: 4 tablets twice a day (4 tablets in the morning and 4 tablets at night) continue at this dosage until seen for follow up    Lamotrigine tablets What is this medicine? LAMOTRIGINE (la MOE Patrecia Pace) is used to control seizures in adults and children with epilepsy and Lennox-Gastaut syndrome. It is also used in adults to treat bipolar disorder. This medicine may be used for other purposes; ask your health care provider or pharmacist if you have questions. COMMON BRAND NAME(S): Lamictal, Subvenite What should I tell my health care provider before I take this medicine? They need to know if you have any of these conditions:  aseptic meningitis during prior use of lamotrigine  depression  folate deficiency  kidney disease  liver disease  suicidal thoughts, plans, or attempt; a previous suicide attempt by you or a family member  an unusual or allergic reaction to lamotrigine or other seizure medications, other medicines, foods, dyes, or preservatives  pregnant or trying to get pregnant  breast-feeding How should I use this medicine? Take this medicine by mouth with a glass of water. Follow the directions on the prescription label. Do not chew these tablets. If this medicine upsets your stomach, take it with food or milk. Take your doses at regular intervals. Do not take your medicine more often than directed. A special MedGuide will be given to  you by the pharmacist with each new prescription and refill. Be sure to read this information carefully each time. Talk to your pediatrician regarding the use of this medicine in children. While this drug may be prescribed for children as young as 2 years for selected conditions, precautions do apply. Overdosage: If you think you have taken too much of this medicine contact a poison control center or emergency room at once. NOTE: This medicine is only for you. Do not share this medicine with others. What if I miss a dose? If you miss a dose, take it as soon as you can. If it is almost time for your next dose, take only that dose. Do not take double or extra doses. What may interact with this medicine?  atazanavir  carbamazepine  male hormones, including contraceptive or birth control pills  lopinavir  methotrexate  phenobarbital  phenytoin  primidone  pyrimethamine  rifampin  ritonavir  trimethoprim  valproic acid This list may not describe all possible interactions. Give your health care provider a list of all the medicines, herbs, non-prescription drugs, or dietary supplements you use. Also tell them if you smoke, drink alcohol, or use illegal drugs. Some items may interact with your medicine. What should I watch for while using this medicine? Visit your doctor or health care provider for regular checks on your progress. If you take this medicine for seizures, wear a Medic Alert bracelet or necklace. Carry an identification card with information about  your condition, medicines, and doctor or health care provider. It is important to take this medicine exactly as directed. When first starting treatment, your dose will need to be adjusted slowly. It may take weeks or months before your dose is stable. You should contact your doctor or health care provider if your seizures get worse or if you have any new types of seizures. Do not stop taking this medicine unless instructed by  your doctor or health care provider. Stopping your medicine suddenly can increase your seizures or their severity. This medicine may cause serious skin reactions. They can happen weeks to months after starting the medicine. Contact your health care provider right away if you notice fevers or flu-like symptoms with a rash. The rash may be red or purple and then turn into blisters or peeling of the skin. Or, you might notice a red rash with swelling of the face, lips or lymph nodes in your neck or under your arms. You may get drowsy, dizzy, or have blurred vision. Do not drive, use machinery, or do anything that needs mental alertness until you know how this medicine affects you. To reduce dizzy or fainting spells, do not sit or stand up quickly, especially if you are an older patient. Alcohol can increase drowsiness and dizziness. Avoid alcoholic drinks. If you are taking this medicine for bipolar disorder, it is important to report any changes in your mood to your doctor or health care provider. If your condition gets worse, you get mentally depressed, feel very hyperactive or manic, have difficulty sleeping, or have thoughts of hurting yourself or committing suicide, you need to get help from your health care provider right away. If you are a caregiver for someone taking this medicine for bipolar disorder, you should also report these behavioral changes right away. The use of this medicine may increase the chance of suicidal thoughts or actions. Pay special attention to how you are responding while on this medicine. Your mouth may get dry. Chewing sugarless gum or sucking hard candy, and drinking plenty of water may help. Contact your doctor if the problem does not go away or is severe. Women who become pregnant while using this medicine may enroll in the [place] American Antiepileptic Drug Pregnancy Registry by calling [id number]. This registry collects information about the safety of antiepileptic drug use  during pregnancy. This medicine may cause a decrease in folic acid. You should make sure that you get enough folic acid while you are taking this medicine. Discuss the foods you eat and the vitamins you take with your health care provider. What side effects may I notice from receiving this medicine? Side effects that you should report to your doctor or health care professional as soon as possible:  allergic reactions like skin rash, itching or hives, swelling of the face, lips, or tongue  changes in vision  depressed mood  elevated mood, decreased need for sleep, racing thoughts, impulsive behavior  loss of balance or coordination  mouth sores  rash, fever, and swollen lymph nodes  redness, blistering, peeling or loosening of the skin, including inside the mouth  right upper belly pain  seizures  severe muscle pain  signs and symptoms of aseptic meningitis such as stiff neck and sensitivity to light, headache, drowsiness, fever, nausea, vomiting, rash  signs of infection - fever or chills, cough, sore throat, pain or difficulty passing urine  suicidal thoughts or other mood changes  swollen lymph nodes  trouble walking  unusual bruising or bleeding  unusually weak or tired  yellowing of the eyes or skin Side effects that usually do not require medical attention (report to your doctor or health care professional if they continue or are bothersome):  diarrhea  dizziness  dry mouth  stuffy nose  tiredness  tremors  trouble sleeping This list may not describe all possible side effects. Call your doctor for medical advice about side effects. You may report side effects to FDA at 1-800-FDA-1088. Where should I keep my medicine? Keep out of reach of children. Store at room temperature between 15 and 30 degrees C (59 and 86 degrees F). Throw away any unused medicine after the expiration date. NOTE: This sheet is a summary. It may not cover all possible  information. If you have questions about this medicine, talk to your doctor, pharmacist, or health care provider.  2020 Elsevier/Gold Standard (2019-02-04 15:03:40)   Seizure, Adult A seizure is a sudden burst of abnormal electrical activity in the brain. Seizures usually last from 30 seconds to 2 minutes. They can cause many different symptoms. Usually, seizures are not harmful unless they last a long time. What are the causes? Common causes of this condition include:  Fever or infection.  Conditions that affect the brain, such as: ? A brain abnormality that you were born with. ? A brain or head injury. ? Bleeding in the brain. ? A tumor. ? Stroke. ? Brain disorders such as autism or cerebral palsy.  Low blood sugar.  Conditions that are passed from parent to child (are inherited).  Problems with substances, such as: ? Having a reaction to a drug or a medicine. ? Suddenly stopping the use of a substance (withdrawal). In some cases, the cause may not be known. A person who has repeated seizures over time without a clear cause has a condition called epilepsy. What increases the risk? You are more likely to get this condition if you have:  A family history of epilepsy.  Had a seizure in the past.  A brain disorder.  A history of head injury, lack of oxygen at birth, or strokes. What are the signs or symptoms? There are many types of seizures. The symptoms vary depending on the type of seizure you have. Examples of symptoms during a seizure include:  Shaking (convulsions).  Stiffness in the body.  Passing out (losing consciousness).  Head nodding.  Staring.  Not responding to sound or touch.  Loss of bladder control and bowel control. Some people have symptoms right before and right after a seizure happens. Symptoms before a seizure may include:  Fear.  Worry (anxiety).  Feeling like you may vomit (nauseous).  Feeling like the room is spinning  (vertigo).  Feeling like you saw or heard something before (dj vu).  Odd tastes or smells.  Changes in how you see. You may see flashing lights or spots. Symptoms after a seizure happens can include:  Confusion.  Sleepiness.  Headache.  Weakness on one side of the body. How is this treated? Most seizures will stop on their own in under 5 minutes. In these cases, no treatment is needed. Seizures that last longer than 5 minutes will usually need treatment. Treatment can include:  Medicines given through an IV tube.  Avoiding things that are known to cause your seizures. These can include medicines that you take for another condition.  Medicines to treat epilepsy.  Surgery to stop the seizures. This may be needed if medicines do not help. Follow these instructions at home:  Medicines  Take over-the-counter and prescription medicines only as told by your doctor.  Do not eat or drink anything that may keep your medicine from working, such as alcohol. Activity  Do not do any activities that would be dangerous if you had another seizure, like driving or swimming. Wait until your doctor says it is safe for you to do them.  If you live in the [place] ask your local DMV (department of motor vehicles) when you can drive.  Get plenty of rest. Teaching others Teach friends and family what to do when you have a seizure. They should:  Lay you on the ground.  Protect your head and body.  Loosen any tight clothing around your neck.  Turn you on your side.  Not hold you down.  Not put anything into your mouth.  Know whether or not you need emergency care.  Stay with you until you are better.  General instructions  Contact your doctor each time you have a seizure.  Avoid anything that gives you seizures.  Keep a seizure diary. Write down: ? What you think caused each seizure. ? What you remember about each seizure.  Keep all follow-up visits as told by your doctor.  This is important. Contact a doctor if:  You have another seizure.  You have seizures more often.  There is any change in what happens during your seizures.  You keep having seizures with treatment.  You have symptoms of being sick or having an infection. Get help right away if:  You have a seizure that: ? Lasts longer than 5 minutes. ? Is different than seizures you had before. ? Makes it harder to breathe. ? Happens after you hurt your head.  You have any of these symptoms after a seizure: ? Not being able to speak. ? Not being able to use a part of your body. ? Confusion. ? A bad headache.  You have two or more seizures in a row.  You do not wake up right after a seizure.  You get hurt during a seizure. These symptoms may be an emergency. Do not wait to see if the symptoms will go away. Get medical help right away. Call your local emergency services (911 in the [place] Do not drive yourself to the hospital. Summary  Seizures usually last from 30 seconds to 2 minutes. Usually, they are not harmful unless they last a long time.  Do not eat or drink anything that may keep your medicine from working, such as alcohol.  Teach friends and family what to do when you have a seizure.  Contact your doctor each time you have a seizure. This information is not intended to replace advice given to you by your health care provider. Make sure you discuss any questions you have with your health care provider. Document Revised: 01/21/2019 Document Reviewed: 01/21/2019 Elsevier Patient Education  2020 [employer].

## 2020-01-03 NOTE — Progress Notes (Signed)
I have read the note, and I agree with the clinical assessment and plan.  James Good   

## 2020-01-23 ENCOUNTER — Telehealth: Payer: Self-pay | Admitting: Family Medicine

## 2020-01-23 ENCOUNTER — Encounter: Payer: Self-pay | Admitting: *Deleted

## 2020-01-23 NOTE — Telephone Encounter (Signed)
Yes, please. TY

## 2020-01-23 NOTE — Telephone Encounter (Signed)
Pt is asking for the letter that was prepared on 02-16 to be revised.  The Tentatively 01-31-20. Date needs to be changed to 03-29 because that is when he is scheduled to see Dr Anne Hahn again.  He needs this correction for work.  Please call when this request is completed

## 2020-01-24 NOTE — Telephone Encounter (Signed)
Note placed up front

## 2020-01-24 NOTE — Telephone Encounter (Signed)
Called and wife will pick up note.

## 2020-02-13 ENCOUNTER — Telehealth: Payer: Self-pay

## 2020-02-13 ENCOUNTER — Ambulatory Visit (INDEPENDENT_AMBULATORY_CARE_PROVIDER_SITE_OTHER): Payer: Managed Care, Other (non HMO) | Admitting: Neurology

## 2020-02-13 ENCOUNTER — Encounter: Payer: Self-pay | Admitting: Neurology

## 2020-02-13 ENCOUNTER — Other Ambulatory Visit: Payer: Self-pay

## 2020-02-13 VITALS — BP 156/92 | HR 97 | Temp 97.5°F | Wt 181.0 lb

## 2020-02-13 DIAGNOSIS — R569 Unspecified convulsions: Secondary | ICD-10-CM

## 2020-02-13 MED ORDER — LAMOTRIGINE ER 200 MG PO TB24: 200.0000 mg | ORAL_TABLET | Freq: Every day | ORAL | 3 refills | Status: DC

## 2020-02-13 NOTE — Telephone Encounter (Signed)
Pt requested a CB in regards to his medication that he has had changed multiple times states he is needing a letter for his job  and forgot to mention it at his OV today.

## 2020-02-13 NOTE — Telephone Encounter (Signed)
The patient did not forget to mention it during the visit today, we spent some time talking about this, it looks like he had an FMLA form, I encouraged him to get the thing filled out but he did not want to do it.

## 2020-02-13 NOTE — Telephone Encounter (Signed)
Patient no show for appt today. 

## 2020-02-13 NOTE — Telephone Encounter (Signed)
Pt was seen for appt today. But he was a hour late.

## 2020-02-13 NOTE — Progress Notes (Signed)
Reason for visit: Seizures  Abhiram Criado III is an 44 y.o. male  History of present illness:  Mr. Power is a 45 year old right-handed black male with a history of seizure type events.  The patient had been placed on Keppra but he could not tolerate the medication because of drowsiness and increased anxiety.  He was switched to Lamictal about 1 month ago and returns for an evaluation.  He was worked up to 100 mg twice a day but he again feels somewhat sluggish on the medication, he cut back to 75 mg twice a day.  You have good days and bad days on the medication.  He has not had any seizure recurrence since 18 July 2019.  He is not operating a motor vehicle at this time, he is not working.  He works for [employer], he has not yet returned back to work.  He has to drive on the job, he walks quite a bit.  He is concerned about returning to work at this time.  Past Medical History:  Diagnosis Date  . Anxiety   . Hypertension     Past Surgical History:  Procedure Laterality Date  . NO PAST SURGERIES      Family History  Problem Relation Age of Onset  . Heart attack Father     Social history:  reports that he has been smoking. He has never used smokeless tobacco. He reports current alcohol use. He reports current drug use. Drug: Methamphetamines.    Allergies  Allergen Reactions  . Depakote [Divalproex Sodium]     Sweats, dizziness    Medications:  Prior to Admission medications   Medication Sig Start Date End Date Taking? Authorizing Provider  amLODipine (NORVASC) 10 MG tablet Take 5 mg by mouth daily.   Yes [provider]  LamoTRIgine (LAMICTAL XR) 200 MG TB24 24 hour tablet Take 1 tablet (200 mg total) by mouth at bedtime. 02/13/20   York Spaniel, MD  levETIRAcetam (KEPPRA) 500 MG tablet 1/2 tablet twice daily for 1 week, then take 1 full tablet twice daily Patient not taking: Reported on 02/13/2020 09/07/19   York Spaniel, MD     ROS:  Out of a complete 14 system review of symptoms, the patient complains only of the following symptoms, and all other reviewed systems are negative.  Seizures Drowsiness, fatigue  Blood pressure (!) 156/92, pulse 97, temperature (!) 97.5 F (36.4 C), weight 181 lb (82.1 kg).  Physical Exam  General: The patient is alert and cooperative at the time of the examination.  Skin: No significant peripheral edema is noted.   Neurologic Exam  Mental status: The patient is alert and oriented x 3 at the time of the examination. The patient has apparent normal recent and remote memory, with an apparently normal attention span and concentration ability.   Cranial nerves: Facial symmetry is present. Speech is normal, no aphasia or dysarthria is noted. Extraocular movements are full. Visual fields are full.  Motor: The patient has good strength in all 4 extremities.  Sensory examination: Soft touch sensation is symmetric on the face, arms, and legs.  Coordination: The patient has good finger-nose-finger and heel-to-shin bilaterally.  Gait and station: The patient has a normal gait. Tandem gait is normal. Romberg is negative. No drift is seen.  Reflexes: Deep tendon reflexes are symmetric.   Assessment/Plan:  1.  History of seizures  The patient has not tolerated the short acting Lamictal, we will switch  him to a long-acting preparation taking 200 mg once at night.  If he continues to have problems on this we may consider a switch over to Dilantin.  He is cleared to return to driving at this time, no further restrictions are added.  He has an FMLA form to be filled out.  He may return to work in full capacity at this time.  He will follow-up here in about 4 months.  We will check blood levels of medication at that time.  Jill Alexanders MD 02/13/2020 10:57 AM  [facility] [address] [place], [place] [address]  Phone [phone] Fax  [phone]

## 2020-02-14 ENCOUNTER — Encounter: Payer: Self-pay | Admitting: Neurology

## 2020-02-14 NOTE — Telephone Encounter (Signed)
I called pt about wanting a letter and not returning to work until he sees how the new medication will work for him. He tried to pick up LamoTRIgine (LAMICTAL XR) 200 MG TB24 24 hour tablet that is only to be taken at bedtime but it was not available. The pharmacy stated the medication should be deliver today or tomorrow. Pt wanted to know should he continue taking the old lamictal dosage until the new one comes in. I stated message will be sent to Dr Anne Hahn.  Pt also stated he spoke with the pharmacist and was told the lamictal xr 200mg  does have side effects. Pt feels like he should not return to work until he sees how the new medication will affect or will he have any side effects from it. 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 I stated Dr Engineer, production stated he can drive with no restriction and return to work full time with no restrictions. Pt feels like he cannot do this until he sees if the medication works for him. I stated message will be sent to Dr.Willis. PT verbalized understanding.Anne Hahn

## 2020-02-14 NOTE — Telephone Encounter (Signed)
I called and talk with the patient, he will stay with his short acting Lamictal until he can get on a long-acting drug.  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  I will write letter to keep him out of work for 2 weeks so that he can get acclimated to the new medication.  He will pick up the letter.

## 2020-02-14 NOTE — Telephone Encounter (Signed)
I spoke with Dr. Anne Hahn that pt wants a letter and to discuss medication for his seizures. Pt was seen yesterday for a follow up appt. I stated the FMLA form states pt is requesting continuous leave of absence. Dr. Anne Hahn stated pt can return to work full time at full capacity. He also can return to driving with no restrictions. The FMLA form can be done for appts every 3 to 6 months. I will call pt.

## 2020-02-14 NOTE — Telephone Encounter (Signed)
Pt called again wanting to know when he will be getting a call back from the Provider to discuss medication and a letter that he is needing. Please advise.

## 2020-02-15 ENCOUNTER — Telehealth: Payer: Self-pay

## 2020-02-15 NOTE — Telephone Encounter (Signed)
Letter for work left at front desk for pt to pick up. Pt will be out of work till 02/27/2020 until he get acclimated to the new lamictal Xr 200mg  per Dr. phone note.

## 2020-02-21 NOTE — Telephone Encounter (Signed)
Pt called re: the paperwork that needed completing by Dr Anne Hahn, Katrina, RN's message was read to pt and he states that has already been received.  Pt stated he gave Dr Anne Hahn 2 sets of paperwork and Dr Anne Hahn told him they would both be completed for him.  Pt has been contacted by his claim worker @ [employer] ph [phone] xt [phone] and fax#[phone].  Pt states this is a time sensitive matter re: his job.  Pt is asking to be called by RN, he is aware that Dr Anne Hahn is not in the office this week.

## 2020-02-22 NOTE — Telephone Encounter (Signed)
Pt called stating he apologizes if he was rude to RN-Katrina, Pt states he is aware RN is doing her job and Charity fundraiser is doing a great job and states he is frustrated and scared to a degree and states the frustration he presented to RN is out of character for him and he apologizes about the conversation and would like a  CB from provider to discuss medications states he has taken about 4 medications and would like to discuss the reactions to his body. Pt also requested a CB from RN to discuss.

## 2020-02-22 NOTE — Telephone Encounter (Signed)
Pt called back and is wanting to speak to the RN. Pt stated and I quote "I need the provider to call me semi retired or not. I know you guys have a way to reach him". Please advise.

## 2020-02-22 NOTE — Telephone Encounter (Signed)
I called pt about the [employer] form. Pt began to discuss the new medication lamictal Xr 200mg  that Dr.Wills prescribed on 02/13/2020. Pt stated he probably will not be able to go back to work any way. He began to discuss the co payment of the medication was 80-90 dollars. He also stated when he was seeing the NP the medication did not change until he saw Dr3/31/2021.I was trying to discuss the form with pt but he continue talking about the medication. THe pt stated he still has not pick  Up the current prescription that was sent 3/292021. He stated the pharmacy stated it should be in today or tomorrow. IN previous phone call pt stated pharmacy told him the lamictal xr 200mg  would be in stock last week.. Pt stated he is still taking the old lamictal dosage. I ask pt if his job requires the [employer] form and letter we sent last week and he stated yes. I ask him did he want the form dates completed for 02/13/2020 to 02/27/2020. PT than said " forget about the form just have Dr. 02/15/2020 give me a call". I stated he will be in the office on Monday.

## 2020-02-22 NOTE — Telephone Encounter (Signed)
I called Velna Hatchet pharmacy tech at [facility] where lamictal 200mg  XR was sent. I stated pt reported to that medication was still not available and had to be order and it should be there today or tomorrow. [place] stated the medication had to be order but it was refill 02/14/2020. It has been ready for 9 days and pt has not pick up. I stated message will be sent to Dr .02/16/2020.

## 2020-02-23 NOTE — Telephone Encounter (Signed)
This telephone call with pt was over 18 minutes.  I return pts call about form that was done from [employer]. I stated to pt that pharmacy was call yesterday and the medication has been ready since 02/14/2020. Pt stated he got a text yesterday. He is going to pick up lamictal 200mg  XR today. Pt was still taking old dosage. PT stated the medication is 85.00 dollars and make sure I note that. 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 wrote. Pt explain he does not think he will be able to return to the job at all. Pt stated he is sleepy all the time and fatigue. Pt was prescribed medication on 02/13/2020 but never pick up. I advise pt the letter was written for work and most jobs dont need both. Pt stated the [employer] form is for americans with disability  Act and its for long term disability. 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 I ask pt was he currently on disability but never got confirmation during the conversation. The pt stated he does not like taking all these medications because none have help his symptoms.I advise pt message will be sent to Dr. 02/15/2020 to discuss next week when he is the office.Anne Hahn

## 2020-02-27 NOTE — Telephone Encounter (Signed)
The patient is to try the long-acting Lamictal medication first, if this is not well-tolerated, we will switch to another medication.

## 2020-05-10 NOTE — Telephone Encounter (Signed)
PTs disability form not done. PT stated not needed in note below. Given to medical records.PT did not pay fee.

## 2020-06-06 ENCOUNTER — Ambulatory Visit: Payer: Managed Care, Other (non HMO) | Admitting: Family Medicine

## 2020-06-13 ENCOUNTER — Telehealth: Payer: Self-pay | Admitting: Neurology

## 2020-06-13 NOTE — Telephone Encounter (Signed)
LVM informing pt that 07/29 appointment was cancelled due to NP being out. Provided office # and requested call back to reschedule.

## 2020-06-14 ENCOUNTER — Ambulatory Visit: Payer: Managed Care, Other (non HMO) | Admitting: Neurology

## 2020-07-12 ENCOUNTER — Telehealth: Payer: Self-pay | Admitting: Neurology

## 2020-07-12 ENCOUNTER — Other Ambulatory Visit: Payer: Self-pay | Admitting: Neurology

## 2020-07-12 NOTE — Telephone Encounter (Signed)
This was already refilled today by our office. I called the pt's wife back and let her know to contact [facility] on [address]. She verbalized appreciation.

## 2020-07-12 NOTE — Telephone Encounter (Signed)
Pt is needing a refill on his LamoTRIgine (LAMICTAL XR) 200 MG TB24 24 hour tablet sent in to the [facility] on [address] Pt is completely out. Please advise.

## 2020-07-25 ENCOUNTER — Telehealth: Payer: Self-pay | Admitting: Neurology

## 2020-07-25 DIAGNOSIS — R569 Unspecified convulsions: Secondary | ICD-10-CM

## 2020-07-25 NOTE — Telephone Encounter (Signed)
Lets check EEG

## 2020-07-25 NOTE — Telephone Encounter (Signed)
Called wife, relayed Dr. Bonnita Hollow message. Scheduled appt for tomorrow at 8:15am. Advised him to check in by 750/800am. Make sure to wear a mask. Advised we do ask he comes in alone unless medically necessary to have assistance.

## 2020-07-25 NOTE — Telephone Encounter (Signed)
Pt's wife called stating that the pt has had episodes of staring off and then breaking into a sweat. This has happened twice already and they are wanting to be advised on what can be causing this.

## 2020-07-25 NOTE — Telephone Encounter (Signed)
Called wife back to further discuss. Yesterday, her husband had episode where he starred off and then broke into a sweat.  He had another episode today. About a month ago, he had an actual seizure due to missing a couple doses of seizure medication. The paramedics came and found his blood sugar to be very low. He has appt with Dr. Anne Hahn 09/19/20 (last appt being 02/13/20). Wanting to know if he needs sooner appt or another EEG to be further evaluated. Confirmed he is taking meds as prescribed. Aware Dr. Anne Hahn is out of the office and message being sent to Dr. Epimenio Foot. Advised we will call back with recommendation.  Current medications: amlodipine 5mg  po qd and lamotrigine 200mg  TB24 24 hour tab, take 1 tablet po qhs (takes at 10pm every night per wife).  FYI-He uses CPAP machine.

## 2020-07-25 NOTE — Addendum Note (Signed)
Addended by: Arther Abbott on: 07/25/2020 05:07 PM   Modules accepted: Orders

## 2020-07-26 ENCOUNTER — Ambulatory Visit (INDEPENDENT_AMBULATORY_CARE_PROVIDER_SITE_OTHER): Payer: Managed Care, Other (non HMO)

## 2020-07-26 DIAGNOSIS — R569 Unspecified convulsions: Secondary | ICD-10-CM

## 2020-07-30 NOTE — Procedures (Signed)
    History:  James Good is a 44 year old gentleman with a history of seizure events.  He has had difficulty tolerating Lamictal.  The patient has had a recent seizure after missing medication dosing, he is also had several episodes of staring off and sweating.  He is being evaluated for this.  This is a routine EEG.  No skull defects are noted.  Medications include amlodipine and Lamictal.  EEG classification: Normal awake  Description of the recording: The background rhythms of this recording consists of a fairly well modulated medium amplitude alpha rhythm of 9 Hz that is reactive to eye opening and closure. As the record progresses, the patient appears to remain in the waking state throughout the recording. Photic stimulation was performed, resulting in a bilateral and symmetric photic driving response. Hyperventilation was also performed, resulting in a minimal buildup of the background rhythm activities without significant slowing seen. At no time during the recording does there appear to be evidence of spike or spike wave discharges or evidence of focal slowing. EKG monitor shows no evidence of cardiac rhythm abnormalities with a heart rate of 54.  Impression: This is a normal EEG recording in the waking state. No evidence of ictal or interictal discharges are seen.

## 2020-08-07 ENCOUNTER — Telehealth: Payer: Self-pay

## 2020-08-07 NOTE — Telephone Encounter (Signed)
-----   Message from York Spaniel, MD sent at 07/30/2020  8:16 AM EDT ----- Please call the patient. The EEG study was normal.

## 2020-08-07 NOTE — Telephone Encounter (Signed)
Pt verified by name and DOB,  normal results given per provider, pt voiced understanding all question answered. °

## 2020-09-19 ENCOUNTER — Encounter: Payer: Self-pay | Admitting: Neurology

## 2020-09-19 ENCOUNTER — Ambulatory Visit: Payer: Managed Care, Other (non HMO) | Admitting: Neurology

## 2020-09-19 ENCOUNTER — Other Ambulatory Visit: Payer: Self-pay

## 2020-09-19 VITALS — BP 130/82 | HR 79 | Ht 68.0 in | Wt 192.4 lb

## 2020-09-19 DIAGNOSIS — R569 Unspecified convulsions: Secondary | ICD-10-CM

## 2020-09-19 MED ORDER — LAMOTRIGINE ER 200 MG PO TB24: 1.0000 | ORAL_TABLET | Freq: Every day | ORAL | 5 refills | Status: DC

## 2020-09-19 NOTE — Progress Notes (Signed)
PATIENT: James Good DOB: [birth year]  REASON FOR VISIT: follow up HISTORY FROM: patient  HISTORY OF PRESENT ILLNESS: Today 09/19/20 James Good is a 44 year old male with history of seizure type events, could not tolerate Keppra, or Depakote.  Short acting Lamictal made him feel drowsy, is on 200 mg extended release Lamictal at night.  In September, he reportedly took himself off the medication, "because he wanted to", no specific adverse effect.  He was off for 3 days, then had recurrent seizure, paramedics came, reportedly blood sugar was very low.  Phone note reports, a few staring episodes.  He restarted the Lamictal afterwards, no recurrent seizure.  Seems to be tolerating okay.  It was expensive, $100 a month, but the price is now coming down.  He can now afford it.  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  Has not been driving.  Had an EEG in September 2021, was normal.  Has OSA on CPAP.  Here today for evaluation unaccompanied.  HISTORY 02/13/2020 Dr. Anne Hahn: James Good is a 44 year old right-handed black male with a history of seizure type events.  The patient had been placed on Keppra but he could not tolerate the medication because of drowsiness and increased anxiety.  He was switched to Lamictal about 1 month ago and returns for an evaluation.  He was worked up to 100 mg twice a day but he again feels somewhat sluggish on the medication, he cut back to 75 mg twice a day.  You have good days and bad days on the medication.  He has not had any seizure recurrence since 18 July 2019.  He is not operating a motor vehicle at this time, he is not working.  He works for [employer], he has not yet returned back to work.  He has to drive on the job, he walks quite a bit.  He is concerned about returning to work at this time.   REVIEW OF SYSTEMS: Out of a complete 14 system review of symptoms, the patient complains only of the  following symptoms, and all other reviewed systems are negative.  Seizure  ALLERGIES: Allergies  Allergen Reactions  . Depakote [Divalproex Sodium]     Sweats, dizziness    HOME MEDICATIONS: Outpatient Medications Prior to Visit  Medication Sig Dispense Refill  . amLODipine (NORVASC) 10 MG tablet Take 5 mg by mouth daily.    . LamoTRIgine 200 MG TB24 24 hour tablet TAKE 1 TABLET BY MOUTH EVERY DAY AT BEDTIME 30 tablet 3   No facility-administered medications prior to visit.    PAST MEDICAL HISTORY: Past Medical History:  Diagnosis Date  . Anxiety   . Hypertension     PAST SURGICAL HISTORY: Past Surgical History:  Procedure Laterality Date  . NO PAST SURGERIES      FAMILY HISTORY: Family History  Problem Relation Age of Onset  . Heart attack Father     SOCIAL HISTORY: Social History   Socioeconomic History  . Marital status: Married    Spouse name: Not on file  . Number of children: Not on file  . Years of education: Not on file  . Highest education level: Not on file  Occupational History  . Not on file  Tobacco Use  . Smoking status: Current Every Day Smoker  . Smokeless tobacco: Never Used  Vaping Use  . Vaping Use: Never used  Substance and Sexual Activity  . Alcohol  use: Yes  . Drug use: Yes    Types: Methamphetamines  . Sexual activity: Yes  Other Topics Concern  . Not on file  Social History Narrative   Right handed   Lives at home with wife   Caffeine~ 6 cups of sweet tea per day     Social Determinants of Health   Financial Resource Strain:   . Difficulty of Paying Living Expenses: Not on file  Food Insecurity:   . Worried About Programme researcher, broadcasting/film/video in the Last Year: Not on file  . Ran Out of Food in the Last Year: Not on file  Transportation Needs:   . Lack of Transportation (Medical): Not on file  . Lack of Transportation (Non-Medical): Not on file  Physical Activity:   . Days of Exercise per Week: Not on file  . Minutes of  Exercise per Session: Not on file  Stress:   . Feeling of Stress : Not on file  Social Connections:   . Frequency of Communication with Friends and Family: Not on file  . Frequency of Social Gatherings with Friends and Family: Not on file  . Attends Religious Services: Not on file  . Active Member of Clubs or Organizations: Not on file  . Attends Banker Meetings: Not on file  . Marital Status: Not on file  Intimate Partner Violence:   . Fear of Current or Ex-Partner: Not on file  . Emotionally Abused: Not on file  . Physically Abused: Not on file  . Sexually Abused: Not on file   PHYSICAL EXAM  Vitals:   09/19/20 1321  BP: 130/82  Pulse: 79  Weight: 192 lb 6.4 oz (87.3 kg)  Height: 5\' 8"  (1.727 m)   Body mass index is 29.25 kg/m.  Generalized: Well developed, in no acute distress   Neurological examination  Mentation: Alert oriented to time, place, history taking. Follows all commands speech and language fluent Cranial nerve II-XII: Pupils were equal round reactive to light. Extraocular movements were full, visual field were full on confrontational test. Facial sensation and strength were normal. Head turning and shoulder shrug were normal and symmetric. Motor: The motor testing reveals 5 over 5 strength of all 4 extremities. Good symmetric motor tone is noted throughout.  Sensory: Sensory testing is intact to soft touch on all 4 extremities. No evidence of extinction is noted.  Coordination: Cerebellar testing reveals good finger-nose-finger and heel-to-shin bilaterally.  Gait and station: Gait is normal.  Reflexes: Deep tendon reflexes are symmetric and normal bilaterally.   DIAGNOSTIC DATA (LABS, IMAGING, TESTING) - I reviewed patient records, labs, notes, testing and imaging myself where available.  Lab Results  Component Value Date   WBC 8.4 07/18/2019   HGB 14.9 07/18/2019   HCT 44.9 07/18/2019   MCV 96.4 07/18/2019   PLT 270 07/18/2019       Component Value Date/Time   NA 136 07/18/2019 1938   K 3.3 (L) 07/18/2019 1938   CL 101 07/18/2019 1938   CO2 23 07/18/2019 1938   GLUCOSE 111 (H) 07/18/2019 1938   BUN <5 (L) 07/18/2019 1938   CREATININE 1.12 07/18/2019 1938   CALCIUM 9.3 07/18/2019 1938   PROT 7.0 09/02/2013 0703   ALBUMIN 3.8 09/02/2013 0703   AST 23 09/02/2013 0703   ALT 13 09/02/2013 0703   ALKPHOS 87 09/02/2013 0703   BILITOT 0.3 09/02/2013 0703   GFRNONAA >60 07/18/2019 1938   GFRAA >60 07/18/2019 1938   No results found  for: CHOL, HDL, LDLCALC, LDLDIRECT, TRIG, CHOLHDL No results found for: WHQP5F No results found for: VITAMINB12 No results found for: TSH  ASSESSMENT AND PLAN 44 y.o. year old male  has a past medical history of Anxiety and Hypertension. here with:  1.  History of seizures -Most recent occurrence in September 2021, when he took himself off Lamictal for unknown reason, restarted on his own, no recurrent episodes -Continue Lamictal XR 200 mg daily -Discussed the importance of not stopping, or missing doses -Check routine blood work today, check Lamictal level for compliance -Have recommended he not drive until seizure-free for 6 months -EEG in September 2021 was normal -Has not been able to tolerate Depakote,, Keppra, IR Lamictal  -Call for seizure activity, follow-up in 6 months or sooner if needed  I spent 30 minutes of face-to-face and non-face-to-face time with patient.  This included previsit chart review, lab review, study review, order entry, electronic health record documentation, patient education.  Margie Ege, AGNP-C, DNP 09/19/2020, 1:45 PM [facility] [address], [address] [place], [place] [address] [phone]

## 2020-09-19 NOTE — Progress Notes (Signed)
I have read the note, and I agree with the clinical assessment and plan.  Johne K Seneca Hoback   

## 2020-09-19 NOTE — Patient Instructions (Addendum)
Continue current dosing of Lamictal  Check routine blood work today  Make sure not miss or stop the Lamictal  Call for seizure activity  See you back in 6 months  No driving until seizure free for 6 months

## 2020-09-20 LAB — COMPREHENSIVE METABOLIC PANEL
ALT: 16 IU/L (ref 0–44)
AST: 21 IU/L (ref 0–40)
Albumin/Globulin Ratio: 1.8 (ref 1.2–2.2)
Albumin: 4.8 g/dL (ref 4.0–5.0)
Alkaline Phosphatase: 107 IU/L (ref 44–121)
BUN/Creatinine Ratio: 4 — ABNORMAL LOW (ref 9–20)
BUN: 5 mg/dL — ABNORMAL LOW (ref 6–24)
Bilirubin Total: 0.2 mg/dL (ref 0.0–1.2)
CO2: 25 mmol/L (ref 20–29)
Calcium: 9.9 mg/dL (ref 8.7–10.2)
Chloride: 100 mmol/L (ref 96–106)
Creatinine, Ser: 1.21 mg/dL (ref 0.76–1.27)
GFR calc Af Amer: 85 mL/min/{1.73_m2} (ref >59)
GFR calc non Af Amer: 73 mL/min/{1.73_m2} (ref >59)
Globulin, Total: 2.7 g/dL (ref 1.5–4.5)
Glucose: 92 mg/dL (ref 65–99)
Potassium: 3.9 mmol/L (ref 3.5–5.2)
Sodium: 140 mmol/L (ref 134–144)
Total Protein: 7.5 g/dL (ref 6.0–8.5)

## 2020-09-20 LAB — CBC WITH DIFFERENTIAL/PLATELET
Basophils Absolute: 0 10*3/uL (ref 0.0–0.2)
Basos: 0 %
EOS (ABSOLUTE): 0.4 10*3/uL (ref 0.0–0.4)
Eos: 5 %
Hematocrit: 47.7 % (ref 37.5–51.0)
Hemoglobin: 16.3 g/dL (ref 13.0–17.7)
Immature Grans (Abs): 0 10*3/uL (ref 0.0–0.1)
Immature Granulocytes: 0 %
Lymphocytes Absolute: 3 10*3/uL (ref 0.7–3.1)
Lymphs: 41 %
MCH: 32.9 pg (ref 26.6–33.0)
MCHC: 34.2 g/dL (ref 31.5–35.7)
MCV: 96 fL (ref 79–97)
Monocytes Absolute: 0.5 10*3/uL (ref 0.1–0.9)
Monocytes: 6 %
Neutrophils Absolute: 3.5 10*3/uL (ref 1.4–7.0)
Neutrophils: 48 %
Platelets: 291 10*3/uL (ref 150–450)
RBC: 4.95 x10E6/uL (ref 4.14–5.80)
RDW: 13.6 % (ref 11.6–15.4)
WBC: 7.4 10*3/uL (ref 3.4–10.8)

## 2020-09-20 LAB — LAMOTRIGINE LEVEL: Lamotrigine Lvl: 6 ug/mL (ref 2.0–20.0)

## 2020-12-24 IMAGING — CT CT HEAD W/O CM
4 series · 17 of 47 positions shown, 19 images · non-contrast
Comparison: Prior CT from 09/02/2013.

CLINICAL DATA: Initial evaluation for acute syncope.

EXAM:
CT HEAD WITHOUT CONTRAST
TECHNIQUE: Contiguous axial images were obtained from the base of the skull
through the vertex without intravenous contrast.

[Series 3: head without · axial · non-contrast · 0.46mm/px · z∈[-41,+84]mm · 7 of 35 slices shown, 9 images]
[im 5/35  brain]
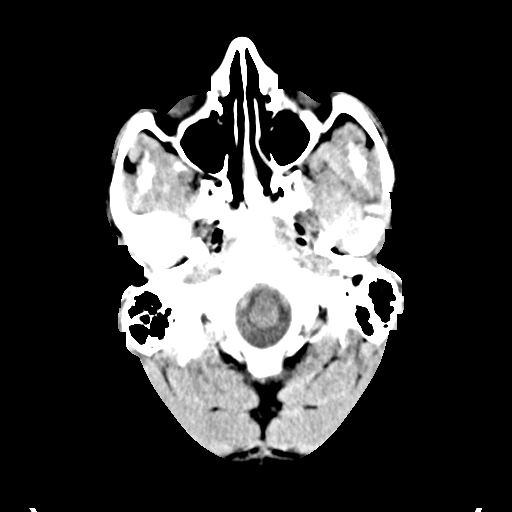
[im 5/35  bone]
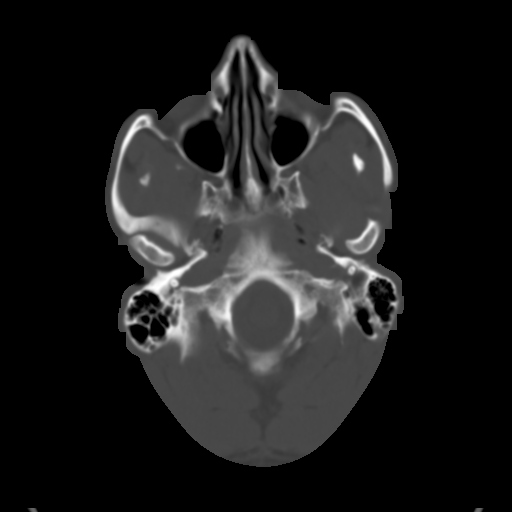
[im 9/35  brain]
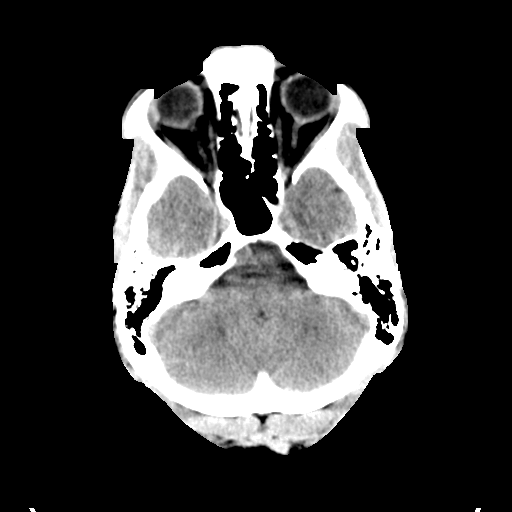
[im 13/35  brain]
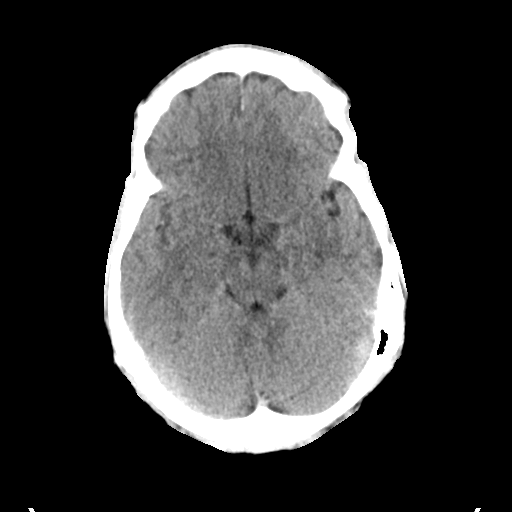
[im 18/35  brain]
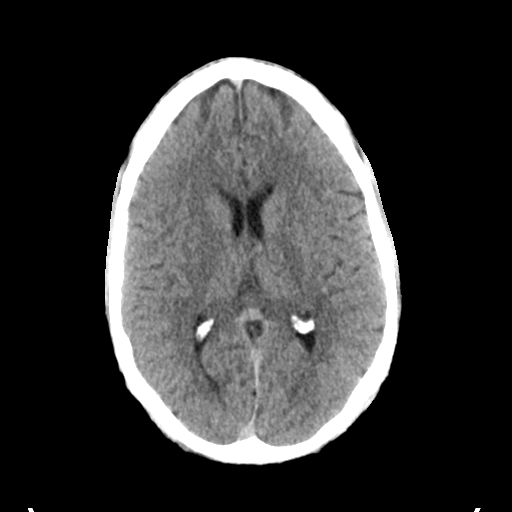
[im 22/35  brain]
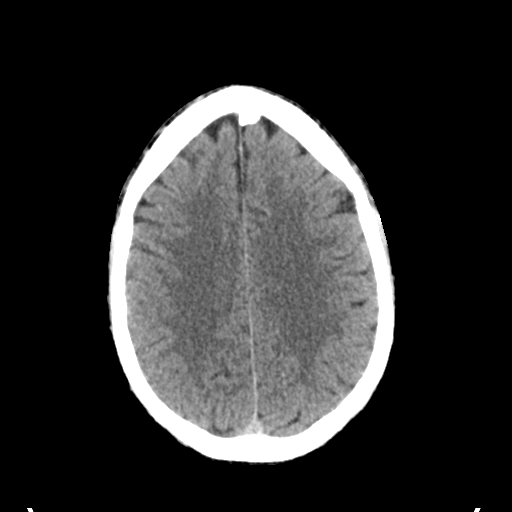
[im 22/35  bone]
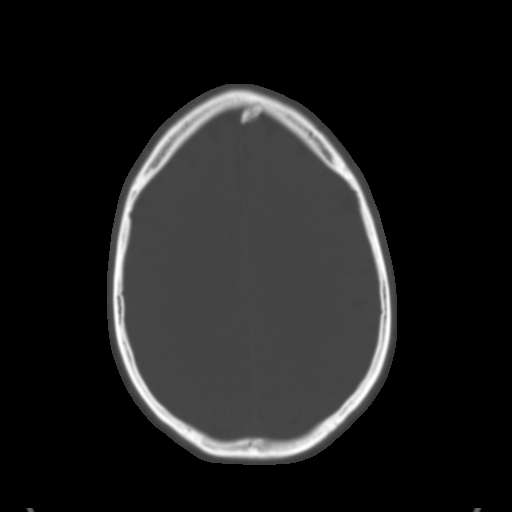
[im 26/35  brain]
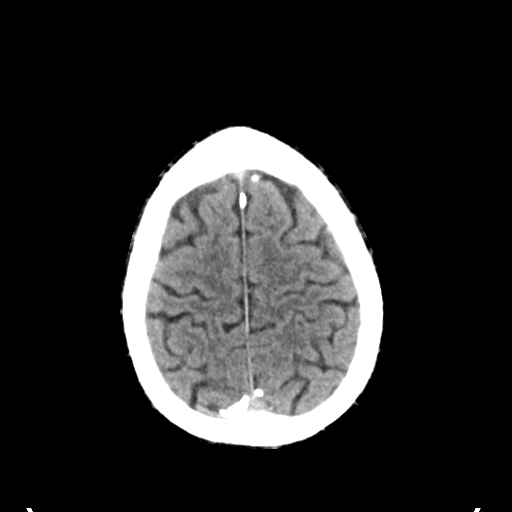
[im 30/35  brain]
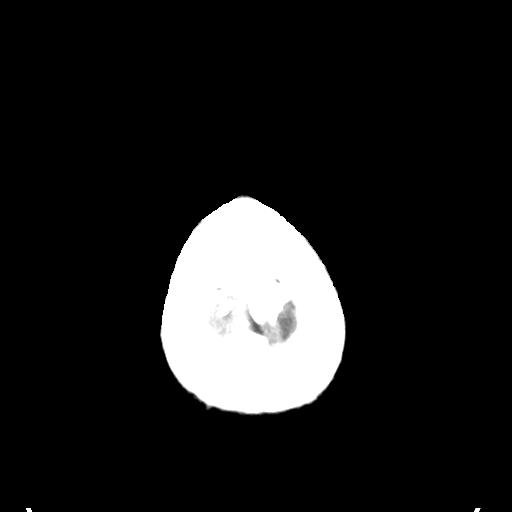

[Series 4: head bone · axial · 0.46mm/px · z∈[-45,+15]mm · 4 of 86 slices shown]
[im 9/86  bone]
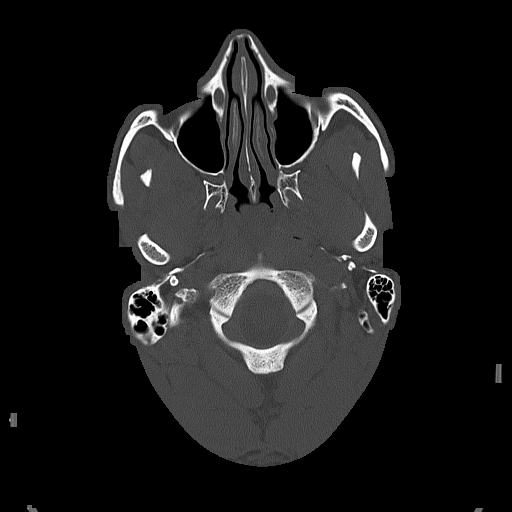
[im 18/86  bone]
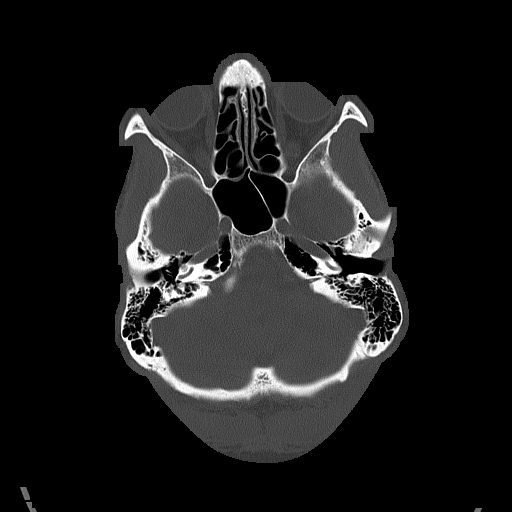
[im 26/86  bone]
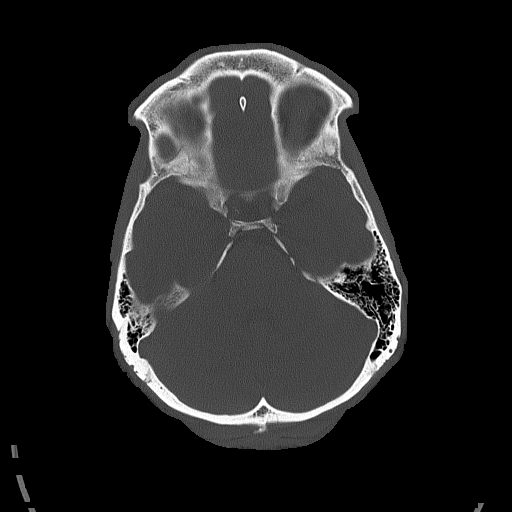
[im 39/86  bone]
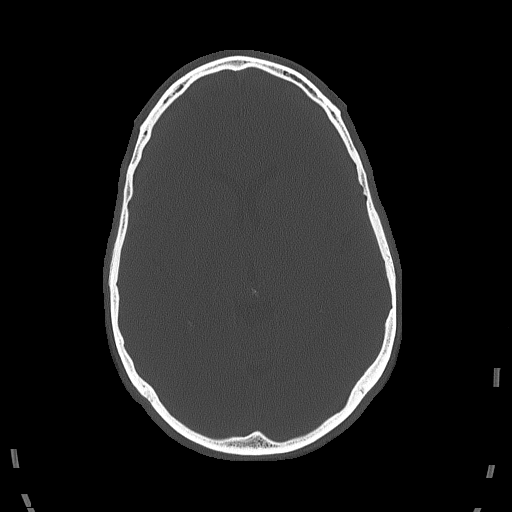

[Series 5: head without cor · coronal · non-contrast · 0.32mm/px · 3 of 75 slices shown]
[im 25/75  brain]
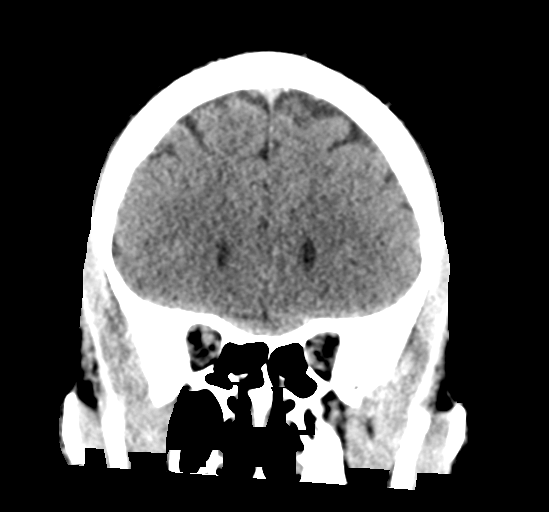
[im 33/75  brain]
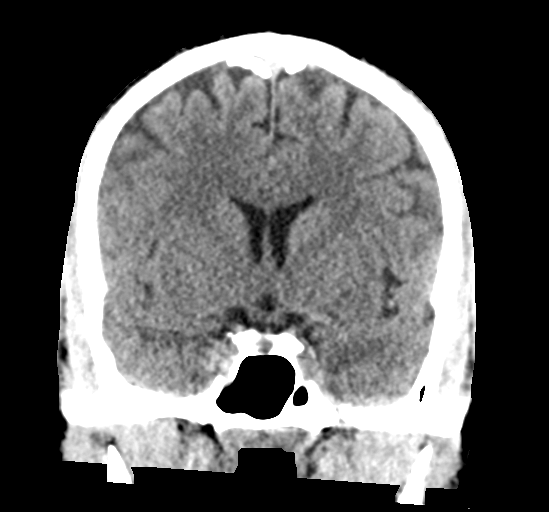
[im 42/75  brain]
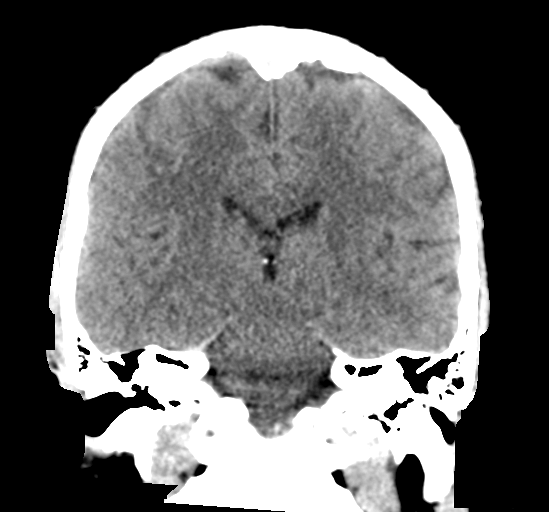

[Series 6: head without sag · sagittal · non-contrast · 0.35mm/px · 3 of 61 slices shown]
[im 21/61  brain]
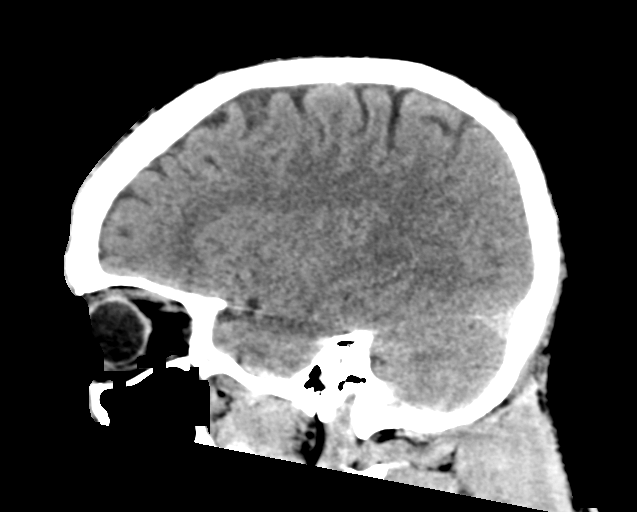
[im 31/61  brain]
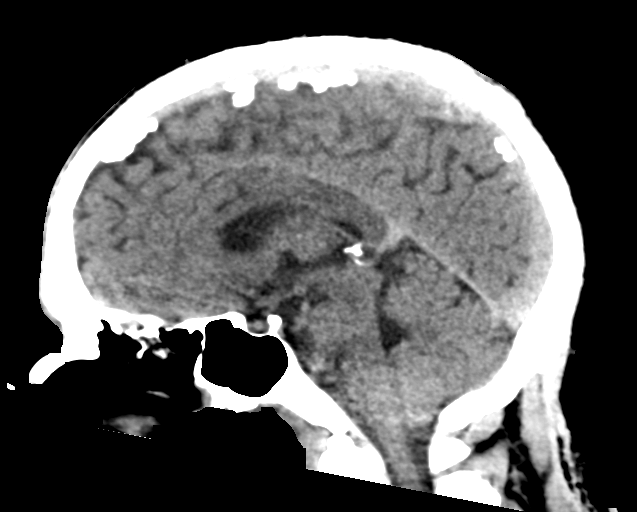
[im 41/61  brain]
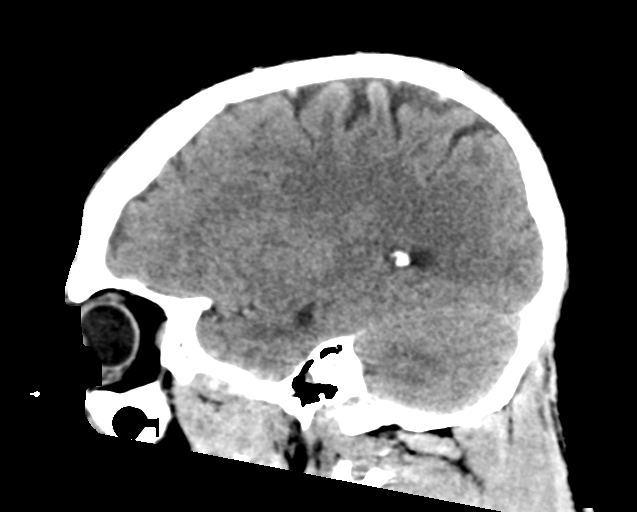

[17 of 47 positions shown; findings below may reference images not displayed]

FINDINGS: Brain: Cerebral volume within normal limits for patient age.

No evidence for acute intracranial hemorrhage. No findings to
suggest acute large vessel territory infarct. No mass lesion,
midline shift, or mass effect. Ventricles are normal in size without
evidence for hydrocephalus. No extra-axial fluid collection
identified.

Vascular: No hyperdense vessel identified.

Skull: Scalp soft tissues demonstrate no acute abnormality.
Calvarium intact.

Sinuses/Orbits: Globes and orbital soft tissues within normal
limits.

Visualized paranasal sinuses are clear. No mastoid effusion.
IMPRESSION: Negative head CT.  No acute intracranial abnormality identified.

## 2020-12-26 ENCOUNTER — Telehealth: Payer: Self-pay | Admitting: Neurology

## 2020-12-26 ENCOUNTER — Encounter: Payer: Self-pay | Admitting: *Deleted

## 2020-12-26 NOTE — Telephone Encounter (Signed)
Pt.'s wife Gala Murdoch is on [place]. She is requesting a letter for husband stating his diagnosis/condition that he would be able to submit for disability. Please advise.   She states husband can also be called for this info. [phone]

## 2020-12-26 NOTE — Telephone Encounter (Signed)
I spoke to pt and wife.  He is asking for a letter stating reason / diagnosis of why we see him.  I relayed that we donot do SSD, we provide records for him.  Would like today if possible.  Letter put in your in box.

## 2021-02-04 ENCOUNTER — Telehealth: Payer: Self-pay | Admitting: Neurology

## 2021-02-04 MED ORDER — LAMOTRIGINE ER 200 MG PO TB24: 1.0000 | ORAL_TABLET | Freq: Every day | ORAL | 1 refills | Status: DC

## 2021-02-04 NOTE — Telephone Encounter (Signed)
Pt is requesting a refill for LamoTRIgine 200 MG TB24 24 hour tablet .  [facility]: [facility]

## 2021-02-04 NOTE — Addendum Note (Signed)
Addended by: Lindell Spar C on: 02/04/2021 01:47 PM   Modules accepted: Orders

## 2021-02-04 NOTE — Telephone Encounter (Signed)
He has pending appt 03/19/21. Pharmacy updated in chart. Refills sent to last until his next appt.

## 2021-02-20 DIAGNOSIS — Z0271 Encounter for disability determination: Secondary | ICD-10-CM

## 2021-03-19 ENCOUNTER — Ambulatory Visit: Payer: Managed Care, Other (non HMO) | Admitting: Neurology

## 2021-03-19 ENCOUNTER — Encounter: Payer: Self-pay | Admitting: Neurology

## 2021-03-19 ENCOUNTER — Other Ambulatory Visit: Payer: Self-pay

## 2021-03-19 VITALS — BP 140/86 | HR 76 | Ht 68.0 in | Wt 194.0 lb

## 2021-03-19 DIAGNOSIS — R569 Unspecified convulsions: Secondary | ICD-10-CM

## 2021-03-19 MED ORDER — LAMOTRIGINE ER 200 MG PO TB24: 1.0000 | ORAL_TABLET | Freq: Every day | ORAL | 11 refills | Status: DC

## 2021-03-19 NOTE — Patient Instructions (Signed)
Call for any seizures Keep Lamictal  No driving until seizure free 6 months See you back in 6 months

## 2021-03-19 NOTE — Progress Notes (Signed)
PATIENT: James Good DOB: 09/21/1976  REASON FOR VISIT: follow up HISTORY FROM: patient  HISTORY OF PRESENT ILLNESS: Today 03/19/21 James Good is a 45 year old male with history of seizure type events.  Is on Lamictal.  Cannot tolerate Keppra or Depakote.  Last seizure event was in September 2021, while off Lamictal. Is currently not working.  Reports compliance with medication, taking Lamictal XR 200 mg at bedtime.  Is no longer driving a car.  He is on CPAP.  Here today for evaluation unaccompanied.  Update 09/19/2020 SS: James Good is a 45 year old male with history of seizure type events, could not tolerate Keppra, or Depakote.  Short acting Lamictal made him feel drowsy, is on 200 mg extended release Lamictal at night.  In September, he reportedly took himself off the medication, "because he wanted to", no specific adverse effect.  He was off for 3 days, then had recurrent seizure, paramedics came, reportedly blood sugar was very low.  Phone note reports, a few staring episodes.  He restarted the Lamictal afterwards, no recurrent seizure.  Seems to be tolerating okay.  It was expensive, $100 a month, but the price is now coming down.  He can now afford it.  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  Has not been driving.  Had an EEG in September 2021, was normal.  Has OSA on CPAP.  Here today for evaluation unaccompanied.  HISTORY 02/13/2020 Dr. Anne Hahn: James Good is a 45 year old right-handed black male with a history of seizure type events.  The patient had been placed on Keppra but he could not tolerate the medication because of drowsiness and increased anxiety.  He was switched to Lamictal about 1 month ago and returns for an evaluation.  He was worked up to 100 mg twice a day but he again feels somewhat sluggish on the medication, he cut back to 75 mg twice a day.  You have good days and bad days on the medication.  He has not had any  seizure recurrence since 18 July 2019.  He is not operating a motor vehicle at this time, he is not working.  He works for [employer], he has not yet returned back to work.  He has to drive on the job, he walks quite a bit.  He is concerned about returning to work at this time.   REVIEW OF SYSTEMS: Out of a complete 14 system review of symptoms, the patient complains only of the following symptoms, and all other reviewed systems are negative.  Seizure  ALLERGIES: Allergies  Allergen Reactions  . Depakote [Divalproex Sodium]     Sweats, dizziness    HOME MEDICATIONS: Outpatient Medications Prior to Visit  Medication Sig Dispense Refill  . amLODipine (NORVASC) 10 MG tablet Take 5 mg by mouth daily.    . LamoTRIgine 200 MG TB24 24 hour tablet Take 1 tablet (200 mg total) by mouth at bedtime. 30 tablet 1   No facility-administered medications prior to visit.    PAST MEDICAL HISTORY: Past Medical History:  Diagnosis Date  . Anxiety   . Hypertension     PAST SURGICAL HISTORY: Past Surgical History:  Procedure Laterality Date  . NO PAST SURGERIES      FAMILY HISTORY: Family History  Problem Relation Age of Onset  . Heart attack Father     SOCIAL HISTORY: Social History   Socioeconomic History  . Marital status: Married  Spouse name: Not on file  . Number of children: Not on file  . Years of education: Not on file  . Highest education level: Not on file  Occupational History  . Not on file  Tobacco Use  . Smoking status: Current Every Day Smoker  . Smokeless tobacco: Never Used  Vaping Use  . Vaping Use: Never used  Substance and Sexual Activity  . Alcohol use: Yes  . Drug use: Yes    Types: Methamphetamines  . Sexual activity: Yes  Other Topics Concern  . Not on file  Social History Narrative   Right handed   Lives at home with wife   Caffeine~ 6 cups of sweet tea per day     Social Determinants of Health   Financial Resource Strain: Not  on file  Food Insecurity: Not on file  Transportation Needs: Not on file  Physical Activity: Not on file  Stress: Not on file  Social Connections: Not on file  Intimate Partner Violence: Not on file   PHYSICAL EXAM  Vitals:   03/19/21 1517  BP: 140/86  Pulse: 76  Weight: 194 lb (88 kg)  Height: 5\' 8"  (1.727 m)   Body mass index is 29.5 kg/m.  Generalized: Well developed, in no acute distress   Neurological examination  Mentation: Alert oriented to time, place, history taking. Follows all commands speech and language fluent Cranial nerve II-XII: Pupils were equal round reactive to light. Extraocular movements were full, visual field were full on confrontational test. Facial sensation and strength were normal. Head turning and shoulder shrug were normal and symmetric. Motor: The motor testing reveals 5 over 5 strength of all 4 extremities. Good symmetric motor tone is noted throughout.  Sensory: Sensory testing is intact to soft touch on all 4 extremities. No evidence of extinction is noted.  Coordination: Cerebellar testing reveals good finger-nose-finger and heel-to-shin bilaterally.  Gait and station: Gait is normal.  Reflexes: Deep tendon reflexes are symmetric and normal bilaterally.   DIAGNOSTIC DATA (LABS, IMAGING, TESTING) - I reviewed patient records, labs, notes, testing and imaging myself where available.  Lab Results  Component Value Date   WBC 7.4 09/19/2020   HGB 16.3 09/19/2020   HCT 47.7 09/19/2020   MCV 96 09/19/2020   PLT 291 09/19/2020      Component Value Date/Time   NA 140 09/19/2020 1513   K 3.9 09/19/2020 1513   CL 100 09/19/2020 1513   CO2 25 09/19/2020 1513   GLUCOSE 92 09/19/2020 1513   GLUCOSE 111 (H) 07/18/2019 1938   BUN 5 (L) 09/19/2020 1513   CREATININE 1.21 09/19/2020 1513   CALCIUM 9.9 09/19/2020 1513   PROT 7.5 09/19/2020 1513   ALBUMIN 4.8 09/19/2020 1513   AST 21 09/19/2020 1513   ALT 16 09/19/2020 1513   ALKPHOS 107  09/19/2020 1513   BILITOT 0.2 09/19/2020 1513   GFRNONAA 73 09/19/2020 1513   GFRAA 85 09/19/2020 1513   No results found for: CHOL, HDL, LDLCALC, LDLDIRECT, TRIG, CHOLHDL No results found for: 13/01/2020 No results found for: VITAMINB12 No results found for: TSH  ASSESSMENT AND PLAN 45 y.o. year old male  has a past medical history of Anxiety and Hypertension. here with:  1.  History of seizures  -Most recent occurrence in September 2021, when he took himself off Lamictal for unknown reason -Continue Lamictal XR 200 mg daily -In November 2021 CBC, CMP were unremarkable, Lamictal level was 6.0 -EEG in September 2021 was normal -Has  not been able to tolerate Depakote, Keppra, IR Lamictal  -Have recommended no driving until seizure-free 6 months -Call for seizure activity, follow-up in 6 months or sooner if needed  Otila Kluver, DNP 03/19/2021, 3:57 PM [facility] [address] [place], [place] [address] [phone]

## 2021-03-19 NOTE — Progress Notes (Signed)
I have read the note, and I agree with the clinical assessment and plan.  Mathew K Jayke Caul   

## 2021-05-08 ENCOUNTER — Telehealth: Payer: Self-pay | Admitting: Neurology

## 2021-05-08 ENCOUNTER — Other Ambulatory Visit: Payer: Self-pay | Admitting: Neurology

## 2021-05-08 DIAGNOSIS — R569 Unspecified convulsions: Secondary | ICD-10-CM

## 2021-05-08 MED ORDER — LAMOTRIGINE ER 200 MG PO TB24: 1.0000 | ORAL_TABLET | Freq: Every day | ORAL | 11 refills | Status: DC

## 2021-05-08 NOTE — Telephone Encounter (Signed)
Pt's wife, Jabin Tapp request refill LamoTRIgine 200 MG TB24 24 hour tablet at [facility] [address] [place], [place], [place] [address] He is out of medication

## 2021-05-08 NOTE — Telephone Encounter (Signed)
[facility] to the [facility]. Rowan Blase made aware and voiced appreciation.

## 2021-05-09 ENCOUNTER — Telehealth: Payer: Self-pay | Admitting: Neurology

## 2021-05-09 NOTE — Telephone Encounter (Signed)
James Good @ [address] [phone]) has called asking for authorization to refill since pt medication since [address] does not have the LamoTRIgine 200 MG TB24 24 hour tablet in stock, please reply

## 2021-05-09 NOTE — Telephone Encounter (Signed)
Spoke to pharmacy, rx sent to [facility]

## 2021-05-10 NOTE — Telephone Encounter (Signed)
Error

## 2021-09-19 ENCOUNTER — Telehealth: Payer: Self-pay | Admitting: Neurology

## 2021-09-19 NOTE — Telephone Encounter (Signed)
LVM for patient to call back to rs due to Maralyn Sago being out.

## 2021-10-08 ENCOUNTER — Ambulatory Visit: Payer: Managed Care, Other (non HMO) | Admitting: Neurology
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
## 2021-12-24 ENCOUNTER — Encounter: Payer: Self-pay | Admitting: Neurology

## 2021-12-24 ENCOUNTER — Ambulatory Visit: Payer: Medicaid Other | Admitting: Neurology

## 2021-12-24 VITALS — BP 147/88 | HR 101 | Ht 68.0 in | Wt 190.2 lb

## 2021-12-24 DIAGNOSIS — R569 Unspecified convulsions: Secondary | ICD-10-CM | POA: Diagnosis not present

## 2021-12-24 MED ORDER — LAMOTRIGINE ER 200 MG PO TB24: 1.0000 | ORAL_TABLET | Freq: Every day | ORAL | 5 refills | Status: DC

## 2021-12-24 NOTE — Patient Instructions (Addendum)
Check labs today Check eeg  Call for any seizure spells See you back in 6 months No driving until seizure free 6 months

## 2021-12-25 LAB — CBC WITH DIFFERENTIAL/PLATELET
Basophils Absolute: 0.1 10*3/uL (ref 0.0–0.2)
Basos: 1 %
EOS (ABSOLUTE): 0.4 10*3/uL (ref 0.0–0.4)
Eos: 5 %
Hematocrit: 48.6 % (ref 37.5–51.0)
Hemoglobin: 16.8 g/dL (ref 13.0–17.7)
Immature Grans (Abs): 0 10*3/uL (ref 0.0–0.1)
Immature Granulocytes: 0 %
Lymphocytes Absolute: 2.9 10*3/uL (ref 0.7–3.1)
Lymphs: 39 %
MCH: 33.3 pg — ABNORMAL HIGH (ref 26.6–33.0)
MCHC: 34.6 g/dL (ref 31.5–35.7)
MCV: 96 fL (ref 79–97)
Monocytes Absolute: 0.4 10*3/uL (ref 0.1–0.9)
Monocytes: 5 %
Neutrophils Absolute: 3.8 10*3/uL (ref 1.4–7.0)
Neutrophils: 50 %
Platelets: 286 10*3/uL (ref 150–450)
RBC: 5.05 x10E6/uL (ref 4.14–5.80)
RDW: 13.8 % (ref 11.6–15.4)
WBC: 7.6 10*3/uL (ref 3.4–10.8)

## 2021-12-25 LAB — COMPREHENSIVE METABOLIC PANEL
ALT: 17 IU/L (ref 0–44)
AST: 19 IU/L (ref 0–40)
Albumin/Globulin Ratio: 1.8 (ref 1.2–2.2)
Albumin: 4.7 g/dL (ref 4.0–5.0)
Alkaline Phosphatase: 109 IU/L (ref 44–121)
BUN/Creatinine Ratio: 4 — ABNORMAL LOW (ref 9–20)
BUN: 5 mg/dL — ABNORMAL LOW (ref 6–24)
Bilirubin Total: 0.2 mg/dL (ref 0.0–1.2)
CO2: 24 mmol/L (ref 20–29)
Calcium: 10.2 mg/dL (ref 8.7–10.2)
Chloride: 103 mmol/L (ref 96–106)
Creatinine, Ser: 1.24 mg/dL (ref 0.76–1.27)
Globulin, Total: 2.6 g/dL (ref 1.5–4.5)
Glucose: 100 mg/dL — ABNORMAL HIGH (ref 70–99)
Potassium: 3.7 mmol/L (ref 3.5–5.2)
Sodium: 143 mmol/L (ref 134–144)
Total Protein: 7.3 g/dL (ref 6.0–8.5)
eGFR: 74 mL/min/{1.73_m2} (ref >59)

## 2021-12-25 LAB — LAMOTRIGINE LEVEL: Lamotrigine Lvl: 6 ug/mL (ref 2.0–20.0)

## 2021-12-26 ENCOUNTER — Telehealth: Payer: Self-pay | Admitting: *Deleted

## 2021-12-26 NOTE — Telephone Encounter (Signed)
-----   Message from Glean Salvo, NP sent at 12/26/2021  8:20 AM EST ----- Labs show no significant abnormality. We have room to increase he Lamictal as level is 6.0. I would recommend a higher dose if seizures are not controlled. Will update once EEG has been completed, scheduled for next week. Thanks!

## 2021-12-26 NOTE — Telephone Encounter (Signed)
Pt verified by name and DOB, results given per provider, pt voiced understanding all question answered. 

## 2021-12-31 ENCOUNTER — Ambulatory Visit: Payer: Medicaid Other | Admitting: Neurology

## 2021-12-31 DIAGNOSIS — R569 Unspecified convulsions: Secondary | ICD-10-CM

## 2022-01-01 NOTE — Procedures (Signed)
° ° °  History:  46 year old man with history of seizures  EEG classification: Awake and drowsy  Description of the recording: The background rhythms of this recording consists of a fairly well modulated medium amplitude alpha rhythm of 9-10 Hz that is reactive to eye opening and closure. As the record progresses, the patient appears to remain in the waking state throughout the recording. Photic stimulation was performed, did not show any abnormalities. Hyperventilation was also performed, did not show any abnormalities. Toward the end of the recording, the patient enters the drowsy state with slight symmetric slowing seen. The patient never enters stage II sleep. No abnormal epileptiform discharges seen during this recording. There was no focal slowing. EKG monitor shows no evidence of cardiac rhythm abnormalities with a heart rate of 72.  Impression: This is a normal EEG recording in the waking and drowsy state. No evidence of interictal epileptiform discharges seen. A normal EEG does not exclude a diagnosis of epilepsy.    Windell Norfolk, MD [facility]

## 2022-01-02 ENCOUNTER — Telehealth: Payer: Self-pay | Admitting: *Deleted

## 2022-01-02 MED ORDER — LAMOTRIGINE ER 250 MG PO TB24: 1.0000 | ORAL_TABLET | Freq: Every day | ORAL | 5 refills | Status: DC

## 2022-01-02 NOTE — Telephone Encounter (Signed)
Spoke with patient and informed of normal EEG results. Directed pt to continue medication and inform office of any seizure activity. Pt verbalized understanding and expressed appreciation for the call. All questions answered.

## 2022-01-02 NOTE — Addendum Note (Signed)
Addended by: Lilla Shook on: 01/02/2022 03:38 PM   Modules accepted: Orders

## 2022-01-02 NOTE — Telephone Encounter (Signed)
Notes from 12/24/21: Lamictal ER 200 mg daily, could try 250 mg daily. ____________________________________  I spoke to the patient. Reports his wife witnessed him having a staring spell last evening, lasting no more than two minutes. He is now agreeable to the increase in medication offered at the last visit.

## 2022-01-02 NOTE — Addendum Note (Signed)
Addended by: Lilla Shook on: 01/02/2022 03:37 PM   Modules accepted: Orders

## 2022-01-02 NOTE — Telephone Encounter (Signed)
Per vo by Margie Ege, NP, okay to send in extended release lamotrigine 250mg , one tablet at bedtime. The patient is aware of this treatment change and verbalized understanding that he cannot drive until six months seizure-free (per [place] law). He will contact our office to report any further seizure activity.

## 2022-01-02 NOTE — Telephone Encounter (Signed)
Pt has called back to say he would like to have the increase in his medication.

## 2022-01-02 NOTE — Telephone Encounter (Signed)
-----   Message from Glean Salvo, NP sent at 01/02/2022  9:06 AM EST ----- Please call the patient, EEG was normal. Would recommend continuing medication, let us know of any seizure activity.   Impression: This is a normal EEG recording in the waking and drowsy state. No evidence of interictal epileptiform discharges seen. A normal EEG does not exclude a diagnosis of epilepsy.

## 2022-02-04 ENCOUNTER — Telehealth: Payer: Self-pay | Admitting: *Deleted

## 2022-02-04 NOTE — Telephone Encounter (Signed)
Pt [place] form ready @ the front desk for p/u. ?

## 2022-03-18 ENCOUNTER — Telehealth: Payer: Self-pay | Admitting: Neurology

## 2022-03-18 DIAGNOSIS — R569 Unspecified convulsions: Secondary | ICD-10-CM

## 2022-03-18 NOTE — Telephone Encounter (Signed)
Pt states he had an episode last Monday 03/10/2022.  ?States his wife saw of his muscles locked, eyes were opened and he was non- responsive.  ?PCP doctor advised him to call [facility] office to inform.  ?Pt would like a call back with advice.  ?

## 2022-03-19 MED ORDER — LAMOTRIGINE ER 300 MG PO TB24: 300.0000 mg | ORAL_TABLET | Freq: Every day | ORAL | 5 refills | Status: DC

## 2022-03-19 NOTE — Telephone Encounter (Signed)
I spoke to the patient. He confirmed he is taking the increase dose of lamotrigine XR 250mg , one tab QHS. Denies any missed doses around the time of the seizure. His wife witnessed him having a staring spell, eyes fixed open, muscles locked, not responding to verbal stimuli. Episode lasted about 3 minutes. Temporary confusion following the spell. ? ?He is agreeable to the increased dose of lamotrigine and proceeding w/ the 24 hour ambulatory EEG. He is aware to expect a call for scheduling. ? ?Reminded him that  law requires him to be seizure free for six months before driving. He expressed understanding.  ?

## 2022-03-19 NOTE — Addendum Note (Signed)
Addended by: Lindell Spar C on: 03/19/2022 12:46 PM ? ? Modules accepted: Orders ? ?

## 2022-03-19 NOTE — Telephone Encounter (Signed)
Can you check to see if he increased his dose of Lamictal to XR 250 mg daily? Any missed doses? Reviewing chart, I don't see any abnormal EEG report, anything he is aware of? If this is a true seizure event, I think we can consider a higher dose of Lamictal XR 300 mg daily, and 24 hr ambulatory EEG.  ?

## 2022-03-20 ENCOUNTER — Telehealth: Payer: Self-pay | Admitting: Neurology

## 2022-03-20 NOTE — Telephone Encounter (Signed)
Faxed Ambulatory EEG order to [facility]. ?

## 2022-05-05 DIAGNOSIS — Z0271 Encounter for disability determination: Secondary | ICD-10-CM

## 2022-06-02 ENCOUNTER — Telehealth: Payer: Self-pay | Admitting: Neurology

## 2022-06-02 DIAGNOSIS — R569 Unspecified convulsions: Secondary | ICD-10-CM

## 2022-06-02 NOTE — Telephone Encounter (Signed)
Pt's wife, Colbey Wirtanen (on [place]) pt had a 2 seizures on yesterday(06/01/22) morning at 6:42 am. Pt was in the bed. Pt was gurgling and shaking, seizure last 3 to 4 minutes, no injuries.  Pt complaining feeling different; tired, dizzy since change LamoTRIgine 300 MG TB24 24 hour tablet. me Concerned because pt usually only has a seizure every 2 months by my records I keep. Pt does not remember anything from yesterday. Would like call from the nurse. I have my husband phone so back on my his phone [phone].

## 2022-06-02 NOTE — Telephone Encounter (Signed)
Can we check on on status of his ambulatory EEG? Did he miss any doses of his Lamictal? We can check a Lamictal level and if no missed doses. Will need to add second agent on, in the past hasn't been able to tolerate Keppra/Depakote. Any other tried medications known? Make sure no driving until seizure free 6 months.

## 2022-06-03 ENCOUNTER — Other Ambulatory Visit: Payer: Self-pay | Admitting: *Deleted

## 2022-06-03 DIAGNOSIS — R569 Unspecified convulsions: Secondary | ICD-10-CM

## 2022-06-03 DIAGNOSIS — Z79899 Other long term (current) drug therapy: Secondary | ICD-10-CM

## 2022-06-03 MED ORDER — ZONISAMIDE 100 MG PO CAPS: 200.0000 mg | ORAL_CAPSULE | Freq: Every evening | ORAL | 5 refills | Status: DC

## 2022-06-03 NOTE — Addendum Note (Signed)
Addended by: Glean Salvo on: 06/03/2022 04:41 PM   Modules accepted: Orders

## 2022-06-03 NOTE — Telephone Encounter (Signed)
I spoke to the patient's wife. Denies any missed doses of lamotrigine. He has only tried Depakote and Keppra in the past, no other seizure meds. He was unable to afford the ambulatory EEG (out-of-pocket cost over $900). She will bring him to our office for labs (order placed). Agreeable to second agent, if Maralyn Sago feels it is appropriate.

## 2022-06-03 NOTE — Telephone Encounter (Signed)
I called his wife, we will add Zonegran starting at 100 mg at bedtime for 1 week then increase to 200 mg at bedtime, stay at this dose, continue the Lamictal. She will bring him this week for labs. Cannot afford ambulatory EEG.   Meds ordered this encounter  Medications   zonisamide (ZONEGRAN) 100 MG capsule    Sig: Take 2 capsules (200 mg total) by mouth at bedtime.    Dispense:  60 capsule    Refill:  5

## 2022-06-03 NOTE — Addendum Note (Signed)
Addended by: Glean Salvo on: 06/03/2022 03:39 PM   Modules accepted: Orders

## 2022-06-03 NOTE — Telephone Encounter (Signed)
Since he cannot tolerate high dose of Lamictal, lets try on on Zonisamide 200 mg nightly rather then Vimpat and Trileptal (both are strong Na channel blockers)

## 2022-06-25 ENCOUNTER — Ambulatory Visit (INDEPENDENT_AMBULATORY_CARE_PROVIDER_SITE_OTHER): Payer: Medicaid Other | Admitting: Neurology

## 2022-06-25 ENCOUNTER — Encounter: Payer: Self-pay | Admitting: Neurology

## 2022-06-25 VITALS — BP 124/80 | HR 77 | Ht 68.0 in | Wt 191.0 lb

## 2022-06-25 DIAGNOSIS — G40009 Localization-related (focal) (partial) idiopathic epilepsy and epileptic syndromes with seizures of localized onset, not intractable, without status epilepticus: Secondary | ICD-10-CM

## 2022-06-25 MED ORDER — VALTOCO 15 MG DOSE 7.5 MG/0.1ML NA LQPK: 15.0000 mg | NASAL | 5 refills | Status: DC | PRN
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
## 2022-06-25 NOTE — Patient Instructions (Signed)
Continue with Lamotrigine 300 mg in the morning  Start with Zonisamide 100 mg nightly for one week then increase to 200 mg nightly  Follow up 2 months

## 2022-08-04 ENCOUNTER — Other Ambulatory Visit: Payer: Self-pay | Admitting: Neurology

## 2022-08-04 ENCOUNTER — Encounter: Payer: Self-pay | Admitting: Neurology

## 2022-08-04 MED ORDER — CLOBAZAM 10 MG PO TABS: 10.0000 mg | ORAL_TABLET | Freq: Every evening | ORAL | 5 refills | Status: DC

## 2022-08-04 NOTE — Progress Notes (Signed)
Unable to tolerate Zonegran due to side effect. We will start him on Clobazam

## 2022-08-05 ENCOUNTER — Telehealth: Payer: Self-pay

## 2022-08-05 NOTE — Telephone Encounter (Signed)
Received and completed medical examination form.  Placed on Md's desk for review and signature if appropriate.

## 2022-08-27 ENCOUNTER — Ambulatory Visit: Payer: Medicaid Other | Admitting: Neurology

## 2022-08-27 ENCOUNTER — Encounter: Payer: Self-pay | Admitting: Neurology

## 2022-08-27 VITALS — BP 145/88 | HR 93 | Ht 68.0 in | Wt 191.0 lb

## 2022-08-27 DIAGNOSIS — Z5181 Encounter for therapeutic drug level monitoring: Secondary | ICD-10-CM

## 2022-08-27 DIAGNOSIS — G4733 Obstructive sleep apnea (adult) (pediatric): Secondary | ICD-10-CM | POA: Diagnosis not present

## 2022-08-27 DIAGNOSIS — G40009 Localization-related (focal) (partial) idiopathic epilepsy and epileptic syndromes with seizures of localized onset, not intractable, without status epilepticus: Secondary | ICD-10-CM

## 2022-08-27 NOTE — Patient Instructions (Addendum)
-  Continue with Lamotrigine XR 300 mg daily  -Continue with clobazam 10 mg daily -We will check Lamictal and clobazam level today including a BMP -In the past, he has not been able to tolerate IR Lamictal, Keppra, or Depakote, zonisamide -He is not currently using CPAP, is aware of risk of untreated OSA.  We will refer him back to Dr. Rexene Alberts -Recommend no driving until seizure-free 6 months -Closely monitor for any seizure spells, report them to [place] -Follow-up in 3 months or sooner of worse.  He is interested in VNS procedure, told him that he if he still having breakthrough seizures we will refer him. He voices understanding
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
## 2022-08-31 LAB — BASIC METABOLIC PANEL
BUN/Creatinine Ratio: 3 — ABNORMAL LOW (ref 9–20)
BUN: 4 mg/dL — ABNORMAL LOW (ref 6–24)
CO2: 26 mmol/L (ref 20–29)
Calcium: 9.8 mg/dL (ref 8.7–10.2)
Chloride: 102 mmol/L (ref 96–106)
Creatinine, Ser: 1.24 mg/dL (ref 0.76–1.27)
Glucose: 90 mg/dL (ref 70–99)
Potassium: 4.1 mmol/L (ref 3.5–5.2)
Sodium: 142 mmol/L (ref 134–144)
eGFR: 74 mL/min/{1.73_m2} (ref >59)

## 2022-08-31 LAB — LAMOTRIGINE LEVEL: Lamotrigine Lvl: 9.2 ug/mL (ref 2.0–20.0)

## 2022-08-31 LAB — CLOBAZAM
Clobazam: 200 ng/mL (ref 30–300)
Desmethylclobazam: 175 ng/mL — ABNORMAL LOW (ref 300–3000)

## 2022-09-15 ENCOUNTER — Other Ambulatory Visit: Payer: Self-pay | Admitting: Neurology

## 2022-09-15 DIAGNOSIS — G4733 Obstructive sleep apnea (adult) (pediatric): Secondary | ICD-10-CM

## 2022-09-19 ENCOUNTER — Other Ambulatory Visit: Payer: Self-pay | Admitting: Neurology

## 2022-09-19 NOTE — Telephone Encounter (Signed)
Received a phone call, patient has run out of his lamotrigine,  Meds ordered this encounter  Medications   LamoTRIgine 300 MG TB24 24 hour tablet    Sig: TAKE ONE TABLET BY MOUTH AT BEDTIME    Dispense:  30 tablet    Refill:  5

## 2022-09-22 NOTE — Telephone Encounter (Signed)
Thank you :)

## 2022-09-25 ENCOUNTER — Ambulatory Visit (INDEPENDENT_AMBULATORY_CARE_PROVIDER_SITE_OTHER): Payer: Medicaid Other

## 2022-09-25 ENCOUNTER — Ambulatory Visit
Admission: EM | Admit: 2022-09-25 | Discharge: 2022-09-25 | Disposition: A | Discharge status: Home / Self Care | Payer: Medicaid Other | Attending: Emergency Medicine | Admitting: Emergency Medicine

## 2022-09-25 DIAGNOSIS — M25512 Pain in left shoulder: Secondary | ICD-10-CM

## 2022-09-25 DIAGNOSIS — M25562 Pain in left knee: Secondary | ICD-10-CM

## 2022-09-25 MED ORDER — NAPROXEN 500 MG PO TABS: 500.0000 mg | ORAL_TABLET | Freq: Two times a day (BID) | ORAL | 0 refills | Status: DC

## 2022-09-25 MED ORDER — BACLOFEN 10 MG PO TABS: 10.0000 mg | ORAL_TABLET | Freq: Three times a day (TID) | ORAL | 0 refills | Status: AC
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
## 2022-09-25 NOTE — Discharge Instructions (Addendum)
All of your x-rays are normal and are not concerning for any bony injury of your knee or shoulder.  I have printed and provided you with a copy of your x-ray reports for your records.  The mainstay of therapy for musculoskeletal pain is reduction of inflammation and relaxation of tension which is causing inflammation.  Keep in mind, pain always begets more pain.  To help you stay ahead of your pain and inflammation, I have provided the following regimen for you:   Please begin taking naproxen 500 mg twice daily.  Please keep in mind that it is always easier to treat a little bit of pain that is to treat a lot of pain.  I recommend that for the next several days, you take this medication on a scheduled basis.  After that, take it when you begin to feel the pain returning, do not wait until you are in a lot of pain.   This evening, you can begin taking baclofen 10 mg.  This is a highly effective muscle relaxer and antispasmodic which should continue to provide you with relaxation of your tense muscles, allow you to sleep well and to keep your pain under control.  You can continue taking this medication 3 times daily as you need to.  If you find that this medication makes you too sleepy, you can break them in half for your daytime doses and, if needed double them for your nighttime dose.  Do not take more than 30 mg of baclofen in a 24-hour period.  During the day, please set aside time to apply ice to the affected area 4 times daily for 20 minutes each application.  This can be achieved by using a bag of frozen peas or corn, a Ziploc bag filled with ice and water, or Ziploc bag filled with half rubbing alcohol and half Dawn dish detergent, frozen into a slush.  Please be careful not to apply ice directly to your skin, always place a soft cloth between you and the ice pack.   Please avoid attempts to stretch or strengthen the affected area until you are feeling completely pain-free.  Attempts to do so will  only prolong the healing process.  If you do not see improvement of your shoulder or knee pain in the next 7 to 10 days, recommend that you follow-up with orthopedic specialist for further evaluation.  MRI may be needed.   I also recommend that you remain out of work for the next several days, I provided you with a note to return to work in 3 days.  If you feel that you need this time extended, please follow-up with your primary care provider or return to urgent care for reevaluation so that we can provide you with a note for another 3 days.   Thank you for visiting urgent care today.  We appreciate the opportunity to participate in your care.

## 2022-09-26 ENCOUNTER — Encounter: Payer: Self-pay | Admitting: Neurology

## 2022-10-22 ENCOUNTER — Institutional Professional Consult (permissible substitution): Payer: Managed Care, Other (non HMO) | Admitting: Neurology

## 2022-10-29 ENCOUNTER — Institutional Professional Consult (permissible substitution): Payer: Managed Care, Other (non HMO) | Admitting: Neurology

## 2022-11-19 ENCOUNTER — Encounter: Payer: Self-pay | Admitting: Neurology

## 2022-11-19 ENCOUNTER — Ambulatory Visit (INDEPENDENT_AMBULATORY_CARE_PROVIDER_SITE_OTHER): Payer: Medicaid Other | Admitting: Neurology

## 2022-11-19 VITALS — BP 136/84 | HR 87 | Ht 68.0 in | Wt 191.8 lb

## 2022-11-19 DIAGNOSIS — G40909 Epilepsy, unspecified, not intractable, without status epilepticus: Secondary | ICD-10-CM

## 2022-11-19 DIAGNOSIS — G4733 Obstructive sleep apnea (adult) (pediatric): Secondary | ICD-10-CM

## 2022-11-19 DIAGNOSIS — E663 Overweight: Secondary | ICD-10-CM
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
## 2022-11-19 NOTE — Patient Instructions (Signed)
It was nice to see you again today.    Based on your history and exam I believe you are still at risk for obstructive sleep apnea and would benefit from reevaluation as it has been over 3 years and you need new supplies for your PAP machine. Therefore, I think we should proceed with a sleep study to determine how severe your sleep apnea is. If you have more than mild OSA, I want you to consider ongoing treatment with CPAP. Please remember, the risks and ramifications of moderate to severe obstructive sleep apnea or OSA are: Cardiovascular disease, including congestive heart failure, stroke, difficult to control hypertension, arrhythmias, and even type 2 diabetes has been linked to untreated OSA. Sleep apnea causes disruption of sleep and sleep deprivation in most cases, which, in turn, can cause recurrent headaches, problems with memory, mood, concentration, focus, and vigilance. Most people with untreated sleep apnea report excessive daytime sleepiness, which can affect their ability to drive. Please do not drive if you feel sleepy.   I will likely see you back after your sleep study to go over the test results and where to go from there. We will call you after your sleep study to advise about the results (most likely, you will hear from [place], my nurse) and to set up an appointment at the time, as necessary.    Our sleep lab administrative assistant will call you to schedule your sleep study. If you don't hear back from her by about 2 weeks from now, please feel free to call her at [phone]. You can leave a message with your phone number and concerns, if you get the voicemail box. She will call back as soon as possible.

## 2022-11-26 ENCOUNTER — Encounter: Payer: Self-pay | Admitting: Neurology

## 2022-12-10 ENCOUNTER — Telehealth: Payer: Self-pay | Admitting: Neurology

## 2022-12-10 ENCOUNTER — Ambulatory Visit (INDEPENDENT_AMBULATORY_CARE_PROVIDER_SITE_OTHER): Payer: Medicaid Other | Admitting: Neurology

## 2022-12-10 ENCOUNTER — Encounter: Payer: Self-pay | Admitting: Neurology

## 2022-12-10 VITALS — BP 132/78 | HR 73 | Ht 68.0 in | Wt 192.0 lb

## 2022-12-10 DIAGNOSIS — G40019 Localization-related (focal) (partial) idiopathic epilepsy and epileptic syndromes with seizures of localized onset, intractable, without status epilepticus: Secondary | ICD-10-CM

## 2022-12-10 DIAGNOSIS — G4733 Obstructive sleep apnea (adult) (pediatric): Secondary | ICD-10-CM | POA: Diagnosis not present
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
## 2022-12-10 NOTE — Telephone Encounter (Signed)
Pt is requesting a call to discuss home sleep test that Dr. Rexene Alberts ordered.

## 2022-12-10 NOTE — Patient Instructions (Signed)
-  Continue with Lamotrigine XR 300 mg daily  -Continue with clobazam 10 mg daily -Follow up with Dr. Rexene Alberts for sleep apnea -Closely monitor for any seizure spells, report them to [place] -Follow-up in 6 months or sooner of worse.  He is interested in VNS procedure, told him that he if he still having breakthrough seizures we will refer him. He voices understanding

## 2022-12-24 NOTE — Telephone Encounter (Signed)
NPSG- MCD Leanne Chang: [id number] (exp. 12/11/22 to 03/11/23)   Patient is scheduled at [facility] for 02/09/23 at 9 pm.  A packet was mailed to the patient.

## 2023-02-03 ENCOUNTER — Encounter: Payer: Self-pay | Admitting: Neurology

## 2023-02-03 NOTE — Telephone Encounter (Signed)
Patient called and stated he is not able to make his 02/09/23 SS due to having a minor surgery  Patient is r/s for 04/05/23 at 8 pm.  Mailed new packet to the patient.

## 2023-02-16 ENCOUNTER — Other Ambulatory Visit: Payer: Self-pay | Admitting: Neurology

## 2023-02-17 NOTE — Telephone Encounter (Signed)
Requested Prescriptions   Pending Prescriptions Disp Refills   cloBAZam (ONFI) 10 MG tablet [Pharmacy Med Name: CLOBAZAM 10 MG TABLET] 30 tablet     Sig: TAKE 1 TABLET BY MOUTH AT BEDTIME   PT last seen 12/10/22 by Dr. April Manson. Routing to provider to fill as it is controlled substance.   Prior Dispenses:   Dispensed Days Supply Quantity Provider Pharmacy  CLOBAZAM 10 MG TABLET 01/15/2023 30 30 each Alric Ran, MD [id number]  CLOBAZAM 10 MG TABLET 12/18/2022 30 30 each Alric Ran, MD [id number]...  CLOBAZAM 10 MG TABLET 11/19/2022 30 30 each Alric Ran, MD [id number]...  CLOBAZAM 10MG  TAB 10/15/2022 30 30 tablet Alric Ran, MD [facility]...  CLOBAZAM 10MG  TAB 09/15/2022 30 30 tablet Alric Ran, MD [facility]...  CLOBAZAM 10MG  TAB 08/08/2022 30 30 tablet Alric Ran, MD [facility]...  CLOBAZAM 10MG  TAB 08/04/2022 30 30 tablet Alric Ran, MD [facility]...       How do dispenses affect the score?

## 2023-02-19 NOTE — Telephone Encounter (Signed)
Updated Environmental consultant.  NPSG- MCD James ChangFA:[id number] (exp. 12/11/22 to 04/25/23)   They will only allow one extension if the patient can't have the study before 04/25/23 a new case would need to be started.

## 2023-03-30 ENCOUNTER — Ambulatory Visit (HOSPITAL_COMMUNITY): Payer: Self-pay | Admitting: Orthopedic Surgery

## 2023-04-02 NOTE — Telephone Encounter (Signed)
Patient called me and canceled his SS appt for 04/05/23. He stated his wife mother is very sick and if something happens to her he doesn't want his wife to be alone. Then next month he is going to have neck surgery. He stated he would give me a call back when he is ready to r/s.

## 2023-04-20 ENCOUNTER — Other Ambulatory Visit: Payer: Self-pay

## 2023-04-20 MED ORDER — VALTOCO 15 MG DOSE 7.5 MG/0.1ML NA LQPK: 15.0000 mg | NASAL | 5 refills | Status: DC | PRN

## 2023-04-20 NOTE — Telephone Encounter (Signed)
Requested Prescriptions   Pending Prescriptions Disp Refills   diazePAM, 15 MG Dose, (VALTOCO 15 MG DOSE) 2 x 7.5 MG/0.1ML LQPK 2 each 5    Sig: Place 15 mg into the nose as needed (for seizure).   Last seen 12/10/22, next appt scheduled for  06/17/23 Dispenses   Dispensed Days Supply Quantity Provider Pharmacy  VALTOCO 15MG  SPR 09/01/2022 1 2 each Windell Norfolk, MD [facility]...  VALTOCO 15MG  SPR 06/26/2022 5 2 each Windell Norfolk, MD [facility]...

## 2023-04-21 ENCOUNTER — Encounter (HOSPITAL_COMMUNITY): Payer: Self-pay

## 2023-04-21 NOTE — Progress Notes (Signed)
     Your surgery and Pre-Admission testing visit will be at [facility] located at [address], [place], [place] [address].  Please let all your doctors (i.e., Primary Care Physician, Cardiologist, Endocrinologist, Pulmonologist) know you are having surgery. You may need clearance for surgery. If you are on blood thinners, notify your surgeon and ask the doctor who prescribed them how long to hold them before surgery.  If you have had a heart test, such as an EKG, stress test, heart ultrasound, etc., or lab work performed outside of [facility], please bring copies of these tests to your Pre-Admission testing, if possible.  These departments may contact you before the day of surgery:  Pre-Service Center - insurance/ billing: [phone] Pharmacy- to review your medications: [phone] Pre-Admission Testing- to set an appointment for your visit: [phone]  (Often, these numbers show up as "SPAM" on your phone)  The Pre-Admission Testing (PAT) visit focuses on Anesthesia for your upcoming surgery.  You do NOT need to fast; take your medications as usual. Please arrive 30 minutes early to allow for parking and admitting.  The visit may last up to an hour. Bring a photo ID and medical insurance card. Reschedule if you are sick. ([phone]) and please, NO children under age 16 at the visit.  During the PAT visit:  We will review your medical and surgical history.   You will receive pre-operative instructions, including the time of arrival at the hospital and surgical start time.  We will review what medication(s) you can take on the day of surgery.  After speaking with the nurse, you will have blood drawn and, if needed, a chest x-ray and EKG.  Most lab results from your doctor are good for 30 days, Hemoglobin A1C is good for 60 days. If you cannot talk to the Pharmacy, bring your medications or a list of them to the PST visit.   Infection control for the [facility] requires: All fingernail and toenail products should be removed before the day of surgery.  (SNS, Acrylic, Gel, Polish, Stickers, Press on, and Poly gel nails.)   Parking information:  Address: [facility] - [address], [place],  [address]  Please look for signs for entrance A off of [address]. Free valet parking is available Monday-Friday 05:30am-06:00pm     

## 2023-04-27 NOTE — Pre-Procedure Instructions (Signed)
Surgical Instructions   Your procedure is scheduled on Monday, June 17th. Report to [facility] Main Entrance "A" at 05:30 A.M., then check in with the Admitting office. Any questions or running late day of surgery: call [phone]  Questions prior to your surgery date: call [phone], Monday-Friday, 8am-4pm. If you experience any cold or flu symptoms such as cough, fever, chills, shortness of breath, etc. between now and your scheduled surgery, please notify us at the above number.     Remember:  Do not eat after midnight the night before your surgery  You may drink clear liquids until 04:30 AM the morning of your surgery.   Clear liquids allowed are: Water, Non-Citrus Juices (without pulp), Carbonated Beverages, Clear Tea, Black Coffee Only (NO MILK, CREAM OR POWDERED CREAMER of any kind), and Gatorade.    Take these medicines the morning of surgery with A SIP OF WATER  amLODipine (NORVASC)  baclofen (LIORESAL)    May take these medicines IF NEEDED: diazePAM    One week prior to surgery, STOP taking any Aspirin (unless otherwise instructed by your surgeon) Aleve, Naproxen, Ibuprofen, Motrin, Advil, Goody's, BC's, all herbal medications, fish oil, and non-prescription vitamins.                     Do NOT Smoke (Tobacco/Vaping) for 24 hours prior to your procedure.  If you use a CPAP at night, you may bring your mask/headgear for your overnight stay.   You will be asked to remove any contacts, glasses, piercing's, hearing aid's, dentures/partials prior to surgery. Please bring cases for these items if needed.    Patients discharged the day of surgery will not be allowed to drive home, and someone needs to stay with them for 24 hours.  SURGICAL WAITING ROOM VISITATION Patients may have no more than 2 support people in the waiting area - these visitors may rotate.   Pre-op nurse will coordinate an appropriate time for 1 ADULT support person, who may not rotate, to accompany  patient in pre-op.  Children under the age of 26 must have an adult with them who is not the patient and must remain in the main waiting area with an adult.  If the patient needs to stay at the hospital during part of their recovery, the visitor guidelines for inpatient rooms apply.  Please refer to the [facility] website for the visitor guidelines for any additional information.   If you received a COVID test during your pre-op visit  it is requested that you wear a mask when out in public, stay away from anyone that may not be feeling well and notify your surgeon if you develop symptoms. If you have been in contact with anyone that has tested positive in the last 10 days please notify you surgeon.      Pre-operative 5 CHG Bath Instructions   You can play a key role in reducing the risk of infection after surgery. Your skin needs to be as free of germs as possible. You can reduce the number of germs on your skin by washing with CHG (chlorhexidine gluconate) soap before surgery. CHG is an antiseptic soap that kills germs and continues to kill germs even after washing.   DO NOT use if you have an allergy to chlorhexidine/CHG or antibacterial soaps. If your skin becomes reddened or irritated, stop using the CHG and notify one of our RNs at [phone].   Please shower with the CHG soap starting 4 days before surgery  using the following schedule:     Please keep in mind the following:  DO NOT shave, including legs and underarms, starting the day of your first shower.   You may shave your face at any point before/day of surgery.  Place clean sheets on your bed the day you start using CHG soap. Use a clean washcloth (not used since being washed) for each shower. DO NOT sleep with pets once you start using the CHG.   CHG Shower Instructions:  If you choose to wash your hair and private area, wash first with your normal shampoo/soap.  After you use shampoo/soap, rinse your hair and body  thoroughly to remove shampoo/soap residue.  Turn the water OFF and apply about 3 tablespoons (45 ml) of CHG soap to a CLEAN washcloth.  Apply CHG soap ONLY FROM YOUR NECK DOWN TO YOUR TOES (washing for 3-5 minutes)  DO NOT use CHG soap on face, private areas, open wounds, or sores.  Pay special attention to the area where your surgery is being performed.  If you are having back surgery, having someone wash your back for you may be helpful. Wait 2 minutes after CHG soap is applied, then you may rinse off the CHG soap.  Pat dry with a clean towel  Put on clean clothes/pajamas   If you choose to wear lotion, please use ONLY the CHG-compatible lotions on the back of this paper.   Additional instructions for the day of surgery: DO NOT APPLY any lotions, deodorants, cologne, or perfumes.   Do not bring valuables to the hospital. [facility] is not responsible for any belongings/valuables. Do not wear nail polish, gel polish, artificial nails, or any other type of covering on natural nails (fingers and toes) Do not wear jewelry or makeup Put on clean/comfortable clothes.  Please brush your teeth.  Ask your nurse before applying any prescription medications to the skin.     CHG Compatible Lotions   Aveeno Moisturizing lotion  Cetaphil Moisturizing Cream  Cetaphil Moisturizing Lotion  Clairol Herbal Essence Moisturizing Lotion, Dry Skin  Clairol Herbal Essence Moisturizing Lotion, Extra Dry Skin  Clairol Herbal Essence Moisturizing Lotion, Normal Skin  Curel Age Defying Therapeutic Moisturizing Lotion with Alpha Hydroxy  Curel Extreme Care Body Lotion  Curel Soothing Hands Moisturizing Hand Lotion  Curel Therapeutic Moisturizing Cream, Fragrance-Free  Curel Therapeutic Moisturizing Lotion, Fragrance-Free  Curel Therapeutic Moisturizing Lotion, Original Formula  Eucerin Daily Replenishing Lotion  Eucerin Dry Skin Therapy Plus Alpha Hydroxy Crme  Eucerin Dry Skin Therapy Plus Alpha  Hydroxy Lotion  Eucerin Original Crme  Eucerin Original Lotion  Eucerin Plus Crme Eucerin Plus Lotion  Eucerin TriLipid Replenishing Lotion  Keri Anti-Bacterial Hand Lotion  Keri Deep Conditioning Original Lotion Dry Skin Formula Softly Scented  Keri Deep Conditioning Original Lotion, Fragrance Free Sensitive Skin Formula  Keri Lotion Fast Absorbing Fragrance Free Sensitive Skin Formula  Keri Lotion Fast Absorbing Softly Scented Dry Skin Formula  Keri Original Lotion  Keri Skin Renewal Lotion Keri Silky Smooth Lotion  Keri Silky Smooth Sensitive Skin Lotion  Nivea Body Creamy Conditioning Oil  Nivea Body Extra Enriched Lotion  Nivea Body Original Lotion  Nivea Body Sheer Moisturizing Lotion Nivea Crme  Nivea Skin Firming Lotion  NutraDerm 30 Skin Lotion  NutraDerm Skin Lotion  NutraDerm Therapeutic Skin Cream  NutraDerm Therapeutic Skin Lotion  ProShield Protective Hand Cream  Provon moisturizing lotion  Please read over the following fact sheets that you were given.

## 2023-04-28 ENCOUNTER — Encounter (HOSPITAL_COMMUNITY): Payer: Self-pay

## 2023-04-28 ENCOUNTER — Encounter (HOSPITAL_COMMUNITY)
Admission: RE | Admit: 2023-04-28 | Discharge: 2023-04-28 | Disposition: A | Discharge status: Home / Self Care | Payer: Medicaid Other | Source: Ambulatory Visit | Attending: Orthopedic Surgery | Admitting: Orthopedic Surgery

## 2023-04-28 ENCOUNTER — Other Ambulatory Visit: Payer: Self-pay

## 2023-04-28 VITALS — BP 136/90 | HR 78 | Temp 97.6°F | Resp 18 | Ht 68.0 in | Wt 185.0 lb

## 2023-04-28 DIAGNOSIS — Z01818 Encounter for other preprocedural examination: Secondary | ICD-10-CM | POA: Insufficient documentation

## 2023-04-28 DIAGNOSIS — I10 Essential (primary) hypertension: Secondary | ICD-10-CM | POA: Insufficient documentation

## 2023-04-28 HISTORY — DX: Fibromyalgia: M79.7

## 2023-04-28 LAB — CBC
HCT: 46.6 % (ref 39.0–52.0)
Hemoglobin: 15.9 g/dL (ref 13.0–17.0)
MCH: 32.6 pg (ref 26.0–34.0)
MCHC: 34.1 g/dL (ref 30.0–36.0)
MCV: 95.5 fL (ref 80.0–100.0)
Platelets: 262 10*3/uL (ref 150–400)
RBC: 4.88 MIL/uL (ref 4.22–5.81)
RDW: 13.4 % (ref 11.5–15.5)
WBC: 8.1 10*3/uL (ref 4.0–10.5)
nRBC: 0 % (ref 0.0–0.2)

## 2023-04-28 LAB — BASIC METABOLIC PANEL
Anion gap: 9 (ref 5–15)
BUN: 5 mg/dL — ABNORMAL LOW (ref 6–20)
CO2: 26 mmol/L (ref 22–32)
Calcium: 9.3 mg/dL (ref 8.9–10.3)
Chloride: 102 mmol/L (ref 98–111)
Creatinine, Ser: 1.28 mg/dL — ABNORMAL HIGH (ref 0.61–1.24)
GFR, Estimated: >60 mL/min (ref >60)
Glucose, Bld: 97 mg/dL (ref 70–99)
Potassium: 3.3 mmol/L — ABNORMAL LOW (ref 3.5–5.1)
Sodium: 137 mmol/L (ref 135–145)

## 2023-04-28 LAB — SURGICAL PCR SCREEN
MRSA, PCR: NEGATIVE
Staphylococcus aureus: NEGATIVE

## 2023-04-28 NOTE — Progress Notes (Signed)
PCP - Dr. Rinaldo Cloud Cardiologist - denies  PPM/ICD - denies   Chest x-ray - denies EKG - 04/28/23 Stress Test - denies ECHO - denies Cardiac Cath - denies  Sleep Study - Sleep study 09/21/19, OSA+ CPAP - denies  DM- denies  ASA/Blood Thinner Instructions: n/a   ERAS Protcol - yes, no drink   COVID TEST- n/a   Anesthesia review: no  Patient denies shortness of breath, fever, cough and chest pain at PAT appointment   All instructions explained to the patient, with a verbal understanding of the material. Patient agrees to go over the instructions while at home for a better understanding.  The opportunity to ask questions was provided.

## 2023-05-04 ENCOUNTER — Encounter (HOSPITAL_COMMUNITY): Payer: Self-pay | Admitting: Orthopedic Surgery

## 2023-05-04 ENCOUNTER — Encounter (HOSPITAL_COMMUNITY)
Admission: RE | Disposition: A | Discharge status: Home / Self Care | Payer: Self-pay | Source: Ambulatory Visit | Attending: Orthopedic Surgery

## 2023-05-04 ENCOUNTER — Other Ambulatory Visit: Payer: Self-pay

## 2023-05-04 ENCOUNTER — Ambulatory Visit (HOSPITAL_COMMUNITY)
Admission: RE | Admit: 2023-05-04 | Discharge: 2023-05-04 | Disposition: A | Discharge status: Home / Self Care | Payer: Medicaid Other | Source: Ambulatory Visit | Attending: Orthopedic Surgery | Admitting: Orthopedic Surgery

## 2023-05-04 ENCOUNTER — Ambulatory Visit (HOSPITAL_COMMUNITY): Payer: Medicaid Other

## 2023-05-04 ENCOUNTER — Ambulatory Visit (HOSPITAL_COMMUNITY): Payer: Medicaid Other | Admitting: Certified Registered"

## 2023-05-04 ENCOUNTER — Ambulatory Visit (HOSPITAL_BASED_OUTPATIENT_CLINIC_OR_DEPARTMENT_OTHER): Payer: Medicaid Other | Admitting: Certified Registered"

## 2023-05-04 DIAGNOSIS — M5011 Cervical disc disorder with radiculopathy,  high cervical region: Secondary | ICD-10-CM | POA: Insufficient documentation

## 2023-05-04 DIAGNOSIS — F17210 Nicotine dependence, cigarettes, uncomplicated: Secondary | ICD-10-CM | POA: Diagnosis not present

## 2023-05-04 DIAGNOSIS — F419 Anxiety disorder, unspecified: Secondary | ICD-10-CM | POA: Diagnosis not present

## 2023-05-04 DIAGNOSIS — I10 Essential (primary) hypertension: Secondary | ICD-10-CM

## 2023-05-04 DIAGNOSIS — M4722 Other spondylosis with radiculopathy, cervical region: Secondary | ICD-10-CM | POA: Insufficient documentation

## 2023-05-04 DIAGNOSIS — R569 Unspecified convulsions: Secondary | ICD-10-CM | POA: Diagnosis not present

## 2023-05-04 DIAGNOSIS — F17200 Nicotine dependence, unspecified, uncomplicated: Secondary | ICD-10-CM | POA: Insufficient documentation

## 2023-05-04 DIAGNOSIS — M797 Fibromyalgia: Secondary | ICD-10-CM | POA: Insufficient documentation

## 2023-05-04 DIAGNOSIS — Z79899 Other long term (current) drug therapy: Secondary | ICD-10-CM | POA: Insufficient documentation

## 2023-05-04 HISTORY — PX: CERVICAL DISC ARTHROPLASTY: SHX587

## 2023-05-04 SURGERY — CERVICAL ANTERIOR DISC ARTHROPLASTY
Anesthesia: General | Site: Spine Cervical

## 2023-05-04 MED ORDER — ONDANSETRON HCL 4 MG/2ML IJ SOLN
INTRAMUSCULAR | Status: DC | PRN
  Administered 2023-05-04: 4 mg via INTRAVENOUS

## 2023-05-04 MED ORDER — LIDOCAINE 2% (20 MG/ML) 5 ML SYRINGE
INTRAMUSCULAR | Status: DC | PRN
  Administered 2023-05-04: 80 mg via INTRAVENOUS

## 2023-05-04 MED ORDER — OXYCODONE HCL 5 MG/5ML PO SOLN: 5.0000 mg | Freq: Once | ORAL | Status: AC | PRN

## 2023-05-04 MED ORDER — OXYCODONE-ACETAMINOPHEN 10-325 MG PO TABS: 1.0000 | ORAL_TABLET | Freq: Four times a day (QID) | ORAL | 0 refills | Status: AC | PRN

## 2023-05-04 MED ORDER — 0.9 % SODIUM CHLORIDE (POUR BTL) OPTIME
TOPICAL | Status: DC | PRN
  Administered 2023-05-04: 500 mL
  Administered 2023-05-04: 1000 mL

## 2023-05-04 MED ORDER — BUPIVACAINE-EPINEPHRINE (PF) 0.25% -1:200000 IJ SOLN
INTRAMUSCULAR | Status: AC
  Filled 2023-05-04: qty 30

## 2023-05-04 MED ORDER — FENTANYL CITRATE (PF) 250 MCG/5ML IJ SOLN
INTRAMUSCULAR | Status: DC | PRN
  Administered 2023-05-04: 150 ug via INTRAVENOUS
  Administered 2023-05-04 (×2): 50 ug via INTRAVENOUS

## 2023-05-04 MED ORDER — PROMETHAZINE HCL 25 MG/ML IJ SOLN: 6.2500 mg | INTRAMUSCULAR | Status: DC | PRN

## 2023-05-04 MED ORDER — HYDROMORPHONE HCL 1 MG/ML IJ SOLN
INTRAMUSCULAR | Status: AC
  Filled 2023-05-04: qty 0.5

## 2023-05-04 MED ORDER — ONDANSETRON HCL 4 MG/2ML IJ SOLN
INTRAMUSCULAR | Status: AC
  Filled 2023-05-04: qty 2

## 2023-05-04 MED ORDER — ROCURONIUM BROMIDE 10 MG/ML (PF) SYRINGE
PREFILLED_SYRINGE | INTRAVENOUS | Status: AC
  Filled 2023-05-04: qty 10

## 2023-05-04 MED ORDER — BUPIVACAINE-EPINEPHRINE 0.25% -1:200000 IJ SOLN
INTRAMUSCULAR | Status: DC | PRN
  Administered 2023-05-04: 7 mL

## 2023-05-04 MED ORDER — SUGAMMADEX SODIUM 200 MG/2ML IV SOLN
INTRAVENOUS | Status: DC | PRN
  Administered 2023-05-04: 200 mg via INTRAVENOUS

## 2023-05-04 MED ORDER — LACTATED RINGERS IV SOLN: INTRAVENOUS | Status: DC

## 2023-05-04 MED ORDER — CEFAZOLIN SODIUM-DEXTROSE 2-4 GM/100ML-% IV SOLN
2.0000 g | INTRAVENOUS | Status: AC
  Administered 2023-05-04: 2 g via INTRAVENOUS
  Filled 2023-05-04: qty 100

## 2023-05-04 MED ORDER — OXYCODONE HCL 5 MG PO TABS
ORAL_TABLET | ORAL | Status: AC
  Filled 2023-05-04: qty 1

## 2023-05-04 MED ORDER — HYDROMORPHONE HCL 1 MG/ML IJ SOLN
INTRAMUSCULAR | Status: AC
  Filled 2023-05-04: qty 1

## 2023-05-04 MED ORDER — THROMBIN 20000 UNITS EX SOLR: OROMUCOSAL | Status: DC | PRN

## 2023-05-04 MED ORDER — MIDAZOLAM HCL 2 MG/2ML IJ SOLN
INTRAMUSCULAR | Status: AC
  Filled 2023-05-04: qty 2

## 2023-05-04 MED ORDER — MIDAZOLAM HCL 2 MG/2ML IJ SOLN
INTRAMUSCULAR | Status: DC | PRN
  Administered 2023-05-04: 2 mg via INTRAVENOUS

## 2023-05-04 MED ORDER — DEXAMETHASONE SODIUM PHOSPHATE 10 MG/ML IJ SOLN
INTRAMUSCULAR | Status: DC | PRN
  Administered 2023-05-04: 10 mg via INTRAVENOUS

## 2023-05-04 MED ORDER — THROMBIN 20000 UNITS EX SOLR
CUTANEOUS | Status: AC
  Filled 2023-05-04: qty 20000

## 2023-05-04 MED ORDER — ONDANSETRON HCL 4 MG PO TABS: 4.0000 mg | ORAL_TABLET | Freq: Three times a day (TID) | ORAL | 0 refills | Status: AC | PRN

## 2023-05-04 MED ORDER — FENTANYL CITRATE (PF) 250 MCG/5ML IJ SOLN
INTRAMUSCULAR | Status: AC
  Filled 2023-05-04: qty 5

## 2023-05-04 MED ORDER — HYDROMORPHONE HCL 1 MG/ML IJ SOLN
INTRAMUSCULAR | Status: DC | PRN
  Administered 2023-05-04 (×4): .5 mg via INTRAVENOUS

## 2023-05-04 MED ORDER — TRANEXAMIC ACID-NACL 1000-0.7 MG/100ML-% IV SOLN
1000.0000 mg | INTRAVENOUS | Status: AC
  Administered 2023-05-04: 1000 mg via INTRAVENOUS
  Filled 2023-05-04: qty 100

## 2023-05-04 MED ORDER — DEXAMETHASONE SODIUM PHOSPHATE 10 MG/ML IJ SOLN
INTRAMUSCULAR | Status: AC
  Filled 2023-05-04: qty 1

## 2023-05-04 MED ORDER — ROCURONIUM BROMIDE 10 MG/ML (PF) SYRINGE
PREFILLED_SYRINGE | INTRAVENOUS | Status: DC | PRN
  Administered 2023-05-04 (×5): 10 mg via INTRAVENOUS
  Administered 2023-05-04: 100 mg via INTRAVENOUS
  Administered 2023-05-04: 10 mg via INTRAVENOUS

## 2023-05-04 MED ORDER — MEPERIDINE HCL 25 MG/ML IJ SOLN: 6.2500 mg | INTRAMUSCULAR | Status: DC | PRN

## 2023-05-04 MED ORDER — SURGIFLO WITH THROMBIN (HEMOSTATIC MATRIX KIT) OPTIME
TOPICAL | Status: DC | PRN
  Administered 2023-05-04: 1 via TOPICAL

## 2023-05-04 MED ORDER — HYDROMORPHONE HCL 1 MG/ML IJ SOLN
0.2500 mg | INTRAMUSCULAR | Status: DC | PRN
  Administered 2023-05-04 (×3): 0.5 mg via INTRAVENOUS

## 2023-05-04 MED ORDER — AMISULPRIDE (ANTIEMETIC) 5 MG/2ML IV SOLN: 10.0000 mg | Freq: Once | INTRAVENOUS | Status: DC | PRN

## 2023-05-04 MED ORDER — PROPOFOL 10 MG/ML IV BOLUS
INTRAVENOUS | Status: AC
  Filled 2023-05-04: qty 20

## 2023-05-04 MED ORDER — OXYCODONE HCL 5 MG PO TABS
5.0000 mg | ORAL_TABLET | Freq: Once | ORAL | Status: AC | PRN
  Administered 2023-05-04: 5 mg via ORAL

## 2023-05-04 MED ORDER — PROPOFOL 10 MG/ML IV BOLUS
INTRAVENOUS | Status: DC | PRN
  Administered 2023-05-04: 50 mg via INTRAVENOUS
  Administered 2023-05-04: 200 mg via INTRAVENOUS

## 2023-05-04 MED ORDER — CHLORHEXIDINE GLUCONATE 0.12 % MT SOLN
15.0000 mL | Freq: Once | OROMUCOSAL | Status: AC
  Administered 2023-05-04: 15 mL via OROMUCOSAL
  Filled 2023-05-04: qty 15

## 2023-05-04 MED ORDER — LIDOCAINE 2% (20 MG/ML) 5 ML SYRINGE
INTRAMUSCULAR | Status: AC
  Filled 2023-05-04: qty 5

## 2023-05-04 MED ORDER — ORAL CARE MOUTH RINSE: 15.0000 mL | Freq: Once | OROMUCOSAL | Status: AC

## 2023-05-04 SURGICAL SUPPLY — 54 items
AGENT HMST KT MTR STRL THRMB (HEMOSTASIS) ×1
BAG COUNTER SPONGE SURGICOUNT (BAG) ×1 IMPLANT
BAG SPNG CNTER NS LX DISP (BAG) ×1
BLADE CLIPPER SURG (BLADE) IMPLANT
CANISTER SUCT 3000ML PPV (MISCELLANEOUS) ×1 IMPLANT
CLSR STERI-STRIP ANTIMIC 1/2X4 (GAUZE/BANDAGES/DRESSINGS) ×1 IMPLANT
COVER MAYO STAND STRL (DRAPES) ×2 IMPLANT
COVER SURGICAL LIGHT HANDLE (MISCELLANEOUS) ×2 IMPLANT
DISC SIMPLIFY 2 HT4 (Neuro Prosthesis/Implant) IMPLANT
DRAIN CHANNEL 15F RND FF W/TCR (WOUND CARE) IMPLANT
DRAPE C-ARM 42X72 X-RAY (DRAPES) ×1 IMPLANT
DRAPE C-ARMOR (DRAPES) IMPLANT
DRAPE POUCH INSTRU U-SHP 10X18 (DRAPES) ×1 IMPLANT
DRAPE SURG 17X23 STRL (DRAPES) ×1 IMPLANT
DRAPE U-SHAPE 47X51 STRL (DRAPES) ×1 IMPLANT
DRSG OPSITE POSTOP 3X4 (GAUZE/BANDAGES/DRESSINGS) ×1 IMPLANT
DURAPREP 26ML APPLICATOR (WOUND CARE) ×1 IMPLANT
ELECT COATED BLADE 2.86 ST (ELECTRODE) ×1 IMPLANT
ELECT PENCIL ROCKER SW 15FT (MISCELLANEOUS) ×1 IMPLANT
ELECT REM PT RETURN 9FT ADLT (ELECTROSURGICAL) ×1
ELECTRODE REM PT RTRN 9FT ADLT (ELECTROSURGICAL) ×1 IMPLANT
GLOVE BIO SURGEON STRL SZ 6.5 (GLOVE) ×1 IMPLANT
GLOVE BIOGEL PI IND STRL 6.5 (GLOVE) ×1 IMPLANT
GLOVE BIOGEL PI IND STRL 8.5 (GLOVE) ×1 IMPLANT
GLOVE SS BIOGEL STRL SZ 8.5 (GLOVE) ×1 IMPLANT
GOWN STRL REUS W/ TWL LRG LVL3 (GOWN DISPOSABLE) ×2 IMPLANT
GOWN STRL REUS W/TWL 2XL LVL3 (GOWN DISPOSABLE) ×1 IMPLANT
GOWN STRL REUS W/TWL LRG LVL3 (GOWN DISPOSABLE) ×2
KIT BASIN OR (CUSTOM PROCEDURE TRAY) ×1 IMPLANT
KIT TURNOVER KIT B (KITS) ×1 IMPLANT
NDL SPNL 18GX3.5 QUINCKE PK (NEEDLE) ×1 IMPLANT
NEEDLE SPNL 18GX3.5 QUINCKE PK (NEEDLE) ×1 IMPLANT
NS IRRIG 1000ML POUR BTL (IV SOLUTION) ×1 IMPLANT
PACK ORTHO CERVICAL (CUSTOM PROCEDURE TRAY) ×1 IMPLANT
PACK UNIVERSAL I (CUSTOM PROCEDURE TRAY) ×1 IMPLANT
PAD ARMBOARD 7.5X6 YLW CONV (MISCELLANEOUS) ×2 IMPLANT
PIN DISTRACTION MAXCESS-C 14 (PIN) IMPLANT
POSITIONER HEAD DONUT 9IN (MISCELLANEOUS) ×1 IMPLANT
RESTRAINT LIMB HOLDER UNIV (RESTRAINTS) ×1 IMPLANT
SPONGE INTESTINAL PEANUT (DISPOSABLE) IMPLANT
SPONGE SURGIFOAM ABS GEL SZ50 (HEMOSTASIS) ×1 IMPLANT
SURGIFLO W/THROMBIN 8M KIT (HEMOSTASIS) ×1 IMPLANT
SUT BONE WAX W31G (SUTURE) ×1 IMPLANT
SUT MNCRL AB 3-0 PS2 27 (SUTURE) ×1 IMPLANT
SUT SILK 3 0 TIES 17X18 (SUTURE)
SUT SILK 3-0 18XBRD TIE BLK (SUTURE) IMPLANT
SUT VIC AB 2-0 CT1 18 (SUTURE) ×1 IMPLANT
SYR BULB IRRIG 60ML STRL (SYRINGE) ×1 IMPLANT
SYR CONTROL 10ML LL (SYRINGE) IMPLANT
TAPE CLOTH 4X10 WHT NS (GAUZE/BANDAGES/DRESSINGS) ×1 IMPLANT
TAPE UMBILICAL 1/8X30 (MISCELLANEOUS) ×1 IMPLANT
TOWEL GREEN STERILE (TOWEL DISPOSABLE) ×1 IMPLANT
TOWEL GREEN STERILE FF (TOWEL DISPOSABLE) ×1 IMPLANT
WATER STERILE IRR 1000ML POUR (IV SOLUTION) ×1 IMPLANT

## 2023-05-04 NOTE — Progress Notes (Signed)
0.5mg  Dilaudid waste at 1600, witnessed by Epifanio Lesches. Unable to waste in Pyxis as patient was discharged.

## 2023-05-04 NOTE — Op Note (Signed)
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  Medium 4 mm  BRIEF HISTORY: James Good is a 47 y.o. male who presented to my office with complaints of significant neck and neuropathic arm pain.  Despite appropriate conservative management his pain and quality of life failed to improve.  Patient had multilevel degenerative disease but had most significant pathology at C3-4.  A C4 selective nerve root block confirmed this with the pain generator prior to providing temporary significant improvement.  As a result of the failure of conservative management to provide any significant improvement we elected to move forward with surgery.  All appropriate risks, benefits, and alternatives to surgery were discussed with patient and consent was obtained.  PROCEDURE DETAILS: Patient was brought into the operating room and was properly positioned on the operating room table.  After induction with general anesthesia the patient was endotracheally intubated.  A timeout was taken to confirm all important data: including patient, procedure, and the level. Teds, SCD's were applied.   The anterior cervical spine was prepped and draped in a standard fashion.  Using fluoroscopy I marked out the C3-4 level and infiltrated the incision site with quarter percent Marcaine with epinephrine.  Transverse incision was made starting from the midline and going to the left.  Sharp dissection was carried out down to and through the platysma.  I then continued dissecting sharply along the avascular  plane performing a standard Smith-Robinson approach to the cervical spine.  The trachea and esophagus were mobilized and swept to the right and identified and protected the carotid sheath, finger laterally.  Hand-held retractor was placed and using Kitner dissectors I remove the remainder of the prevertebral fascia to expose the anterior longitudinal ligament.  Needle was placed into the C3-4 disc space and intraoperative x-ray confirmed I was at the appropriate level.  Bipolar cautery was were then used to mobilize the longus coli muscle from the midportion of C3 to the midportion of C4.  I then used a Leksell rongeur to remove the anterior osteophyte.  An annulotomy was performed and using pituitary rongeurs I removed the bulk of the disc material.  I continued using curettes to remove disc material and worked my way posteriorly.  Ultimately I placed distraction pins into the midportion of C3 and C4 then distracted the intervertebral space with a lamina spreader.  The distraction was maintained with the distraction pin set.  I continued to work posteriorly performing my discectomy.  As I neared the posterior annulus I then used my 1 mm Kerrison rongeur and fine nerve hook to remove the posterior annulus.  I then began gently dissecting through the PLL until I was able to create a plane between the PLL and the thecal sac.  Using my 1 mm Kerrison rongeur I resected the PLL and expose the anterior surface of the thecal sac.  The uncovertebral joints were then decompressed to further improve the neural decompression.  Bone spurs were removed from the posterior aspect of the vertebral body as seen on the preoperative MRI.  Under live fluoroscopy I confirmed to had parallel endplate distraction and  that I could easily pass my nerve hook under the uncovertebral joints bilaterally and behind the vertebral bodies of C3 and C4.  I then placed the trial device and make sure it was properly seated and positioned in both  the AP and lateral planes.  I then used the osteotome to create the fin cut and then irrigated the wound copiously normal saline.  I then confirmed no fragments of bone material had been pushed back and at the decompression remained satisfactory.  Once confirmed I then obtained the implant and gently malleted it into proper position.  I confirmed satisfactory final position in both the AP and lateral planes.  The distraction pins were then removed and the resulting hole was sealed with bone wax.  All bony edges were sealed with bone wax and the bleeding was controlled with bipolar cautery.  Once hemostasis was obtained I removed the retractors and copiously irrigated the wound with normal saline.  I then returned the trach and esophagus to midline and closed platysma with interrupted 2-0 Vicryl suture, and a 3-0 Monocryl for the skin.  Steri-Strips and dry dressing were applied and the patient was ultimately extubated and transferred to PACU without incident.  The end of the case all needle sponge counts were correct.  There were no adverse intraoperative events.  Venita Lick, MD 05/04/2023 11:11 AM

## 2023-05-04 NOTE — Anesthesia Postprocedure Evaluation (Signed)
Anesthesia Post Note  Patient: James Good  Procedure(s) Performed: TOTAL DISC REPLACEMENT CERVICAL THREE TO FOUR (Spine Cervical)     Patient location during evaluation: PACU Anesthesia Type: General Level of consciousness: awake and alert Pain management: pain level controlled Vital Signs Assessment: post-procedure vital signs reviewed and stable Respiratory status: spontaneous breathing, nonlabored ventilation and respiratory function stable Cardiovascular status: blood pressure returned to baseline and stable Postop Assessment: no apparent nausea or vomiting Anesthetic complications: no   No notable events documented.  Last Vitals:  Vitals:   05/04/23 1200 05/04/23 1215  BP: (!) 149/87 137/84  Pulse: 97 88  Resp: 18 12  Temp:    SpO2: 94% 93%    Last Pain:  Vitals:   05/04/23 1215  TempSrc:   PainSc: 7                  Lowella Curb

## 2023-05-04 NOTE — Transfer of Care (Signed)
Immediate Anesthesia Transfer of Care Note  Patient: James Good  Procedure(s) Performed: TOTAL DISC REPLACEMENT CERVICAL THREE TO FOUR (Spine Cervical)  Patient Location: PACU  Anesthesia Type:General  Level of Consciousness: awake, alert , oriented, and patient cooperative  Airway & Oxygen Therapy: Patient Spontanous Breathing and Patient connected to face mask oxygen  Post-op Assessment: Report given to RN, Post -op Vital signs reviewed and stable, and Patient moving all extremities  Post vital signs: Reviewed and stable  Last Vitals:  Vitals Value Taken Time  BP 130/86 05/04/23 1115  Temp 36.9 C 05/04/23 1115  Pulse 82 05/04/23 1120  Resp 26 05/04/23 1120  SpO2 94 % 05/04/23 1120  Vitals shown include unvalidated device data.  Last Pain:  Vitals:   05/04/23 0552  TempSrc:   PainSc: 8          Complications: No notable events documented.

## 2023-05-04 NOTE — Anesthesia Procedure Notes (Signed)
Procedure Name: Intubation Date/Time: 05/04/2023 7:41 AM  Performed by: Brynda Peon, CRNAPre-anesthesia Checklist: Patient identified, Emergency Drugs available, Suction available, Patient being monitored and Timeout performed Patient Re-evaluated:Patient Re-evaluated prior to induction Oxygen Delivery Method: Circle system utilized Preoxygenation: Pre-oxygenation with 100% oxygen Induction Type: IV induction Ventilation: Mask ventilation without difficulty and Oral airway inserted - appropriate to patient size Laryngoscope Size: Glidescope, Mac and 3 Grade View: Grade I Tube type: Oral Tube size: 7.5 mm Number of attempts: 1 Airway Equipment and Method: Stylet and Video-laryngoscopy Placement Confirmation: ETT inserted through vocal cords under direct vision, positive ETCO2 and breath sounds checked- equal and bilateral Secured at: 23 cm Tube secured with: Tape Dental Injury: Teeth and Oropharynx as per pre-operative assessment

## 2023-05-04 NOTE — Anesthesia Preprocedure Evaluation (Addendum)
Anesthesia Evaluation  Patient identified by MRN, date of birth, ID band Patient awake    Reviewed: Allergy & Precautions, H&P , NPO status , Patient's Chart, lab work & pertinent test results  Airway Mallampati: II  TM Distance: >3 FB Neck ROM: Full    Dental  (+) Edentulous Upper, Edentulous Lower   Pulmonary Current Smoker and Patient abstained from smoking.   Pulmonary exam normal breath sounds clear to auscultation       Cardiovascular hypertension, Pt. on medications Normal cardiovascular exam Rhythm:Regular Rate:Normal     Neuro/Psych Seizures -,   Anxiety      negative psych ROS   GI/Hepatic negative GI ROS, Neg liver ROS,,,  Endo/Other  negative endocrine ROS    Renal/GU negative Renal ROS  negative genitourinary   Musculoskeletal  (+)  Fibromyalgia -  Abdominal   Peds negative pediatric ROS (+)  Hematology negative hematology ROS (+)   Anesthesia Other Findings   Reproductive/Obstetrics negative OB ROS                             Anesthesia Physical Anesthesia Plan  ASA: 3  Anesthesia Plan: General   Post-op Pain Management: Dilaudid IV   Induction: Intravenous  PONV Risk Score and Plan: 1 and Ondansetron and Treatment may vary due to age or medical condition  Airway Management Planned: Oral ETT  Additional Equipment:   Intra-op Plan:   Post-operative Plan: Extubation in OR  Informed Consent: I have reviewed the patients History and Physical, chart, labs and discussed the procedure including the risks, benefits and alternatives for the proposed anesthesia with the patient or authorized representative who has indicated his/her understanding and acceptance.     Dental advisory given  Plan Discussed with: CRNA  Anesthesia Plan Comments:        Anesthesia Quick Evaluation

## 2023-05-04 NOTE — Discharge Instructions (Signed)

## 2023-05-04 NOTE — Brief Op Note (Signed)
05/04/2023  11:18 AM I PATIENT:  James Good  47 y.o. 

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
## 2023-05-05 ENCOUNTER — Encounter: Payer: Self-pay | Admitting: Neurology

## 2023-05-05 MED ORDER — VALTOCO 15 MG DOSE 7.5 MG/0.1ML NA LQPK: 15.0000 mg | NASAL | 5 refills | Status: DC | PRN

## 2023-05-06 ENCOUNTER — Encounter (HOSPITAL_COMMUNITY): Payer: Self-pay | Admitting: Orthopedic Surgery

## 2023-06-17 ENCOUNTER — Ambulatory Visit: Payer: Medicaid Other | Admitting: Neurology

## 2023-06-17 ENCOUNTER — Encounter: Payer: Self-pay | Admitting: Neurology

## 2023-06-17 VITALS — BP 132/80 | HR 83 | Ht 68.0 in | Wt 183.0 lb

## 2023-06-17 DIAGNOSIS — G40019 Localization-related (focal) (partial) idiopathic epilepsy and epileptic syndromes with seizures of localized onset, intractable, without status epilepticus: Secondary | ICD-10-CM | POA: Diagnosis not present

## 2023-06-17 DIAGNOSIS — G4733 Obstructive sleep apnea (adult) (pediatric): Secondary | ICD-10-CM

## 2023-06-17 MED ORDER — LAMOTRIGINE ER 300 MG PO TB24: 1.0000 | ORAL_TABLET | Freq: Every day | ORAL | 3 refills | Status: DC
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
## 2023-06-25 ENCOUNTER — Encounter: Payer: Self-pay | Admitting: Neurology

## 2023-06-30 ENCOUNTER — Encounter: Payer: Self-pay | Admitting: Neurology

## 2023-06-30 NOTE — Telephone Encounter (Signed)
I agree, please ask if he started speech therapy, if not then we can refer him

## 2023-08-17 ENCOUNTER — Other Ambulatory Visit: Payer: Self-pay | Admitting: Neurology

## 2023-08-17 NOTE — Telephone Encounter (Signed)
Dispensed Days Supply Quantity Provider Pharmacy  CLOBAZAM 10 MG TABLET 07/18/2023 30 30 each Windell Norfolk, MD [facility] [phone] - G...  CLOBAZAM 10 MG TABLET 06/18/2023 30 30 each Windell Norfolk, MD [facility] [phone] - G...  CLOBAZAM 10 MG TABLET 05/17/2023 30 30 each Windell Norfolk, MD [facility] [phone] - G...  CLOBAZAM 10 MG TABLET 04/17/2023 30 30 each Windell Norfolk, MD [facility] [phone] - G...  CLOBAZAM 10 MG TABLET 03/17/2023 30 30 each Windell Norfolk, MD [facility] [phone] - G...  CLOBAZAM 10 MG TABLET 02/17/2023 30 30 each Windell Norfolk, MD [facility] [phone] - G...  CLOBAZAM 10 MG TABLET 01/15/2023 30 30 each Windell Norfolk, MD [facility] [phone] - G...  CLOBAZAM 10 MG TABLET 12/18/2022 30 30 each Windell Norfolk, MD [facility] [phone] - G...  CLOBAZAM 10 MG TABLET 11/19/2022 30 30 each Windell Norfolk, MD [facility] [phone] - G...  CLOBAZAM 10 MG TABLET 10/15/2022 30 30 tablet Windell Norfolk, MD [facility]...  CLOBAZAM 10MG  TAB 09/15/2022 30 30 tablet Windell Norfolk, MD [facility]...    Last visit 06/17/23 Next visit 06/16/24

## 2023-11-26 ENCOUNTER — Other Ambulatory Visit: Payer: Self-pay | Admitting: Neurology

## 2023-11-26 NOTE — Telephone Encounter (Signed)
 Requested Prescriptions   Pending Prescriptions Disp Refills   VALTOCO  15 MG DOSE 7.5 MG/0.1ML LQPK [Pharmacy Med Name: VALTOCO  15 MG NASAL SPRAY] 2 each     Sig: PLACE 15 MG INTO THE NOSE AS NEEDED (FOR SEIZURE).   Last seen 06/17/23, next appt 06/16/24 Dispenses   Dispensed Days Supply Quantity Provider Pharmacy  VALTOCO  15 MG NASAL SPRAY 06/29/2023 28 2 each Camara, Amadou, MD [facility] [phone] - G...  VALTOCO  15 MG NASAL SPRAY 05/06/2023 28 2 each Camara, Amadou, MD [facility] [phone] - G...      How do dispenses affect the score?

## 2023-11-30 ENCOUNTER — Telehealth: Payer: Self-pay | Admitting: Neurology

## 2023-11-30 NOTE — Telephone Encounter (Signed)
 30 days.

## 2023-11-30 NOTE — Telephone Encounter (Signed)
 [facility] with [facility] states they're needing clarification on directions for Escribe sent over for nasal spray. Requesting call back to [phone]

## 2023-11-30 NOTE — Telephone Encounter (Signed)
 Called and relayed 30 days but they stated that they already picked it up

## 2023-11-30 NOTE — Telephone Encounter (Signed)
 Called and spoke to pharmacy and they stated they needed a days supply. I told them 90 day just wanted to be sure

## 2023-12-16 ENCOUNTER — Ambulatory Visit: Payer: Managed Care, Other (non HMO) | Admitting: Neurology

## 2024-02-17 ENCOUNTER — Other Ambulatory Visit: Payer: Self-pay | Admitting: Neurology

## 2024-02-17 NOTE — Telephone Encounter (Signed)
 Requested Prescriptions   Pending Prescriptions Disp Refills   cloBAZam (ONFI) 10 MG tablet [Pharmacy Med Name: CLOBAZAM 10 MG TABLET] 30 tablet     Sig: TAKE 1 TABLET BY MOUTH EVERYDAY AT BEDTIME   Last seen 06/17/23, next appt 06/16/24  Dispenses   Dispensed Days Supply Quantity Provider Pharmacy  CLOBAZAM 10 MG TABLET 01/18/2024 30 30 each Windell Norfolk, MD [facility] [phone] - G...  CLOBAZAM 10 MG TABLET 12/16/2023 30 30 each Windell Norfolk, MD [facility] [phone] - G...  CLOBAZAM 10 MG TABLET 11/16/2023 30 30 each Windell Norfolk, MD [facility] [phone] - G...  CLOBAZAM 10 MG TABLET 10/16/2023 30 30 each Windell Norfolk, MD [facility] [phone] - G...  CLOBAZAM 10 MG TABLET 09/17/2023 30 30 each Windell Norfolk, MD [facility] [phone] - G...  CLOBAZAM 10 MG TABLET 08/17/2023 30 30 each Windell Norfolk, MD [facility] [phone] - G...  CLOBAZAM 10 MG TABLET 07/18/2023 30 30 each Windell Norfolk, MD [facility] [phone] - G...  CLOBAZAM 10 MG TABLET 06/18/2023 30 30 each Windell Norfolk, MD [facility] [phone] - G...  CLOBAZAM 10 MG TABLET 05/17/2023 30 30 each Windell Norfolk, MD [facility] [phone] - G...  CLOBAZAM 10 MG TABLET 04/17/2023 30 30 each Windell Norfolk, MD [facility] [phone] - G...  CLOBAZAM 10 MG TABLET 03/17/2023 30 30 each Windell Norfolk, MD [facility] [phone] - G.Marland KitchenMarland Kitchen

## 2024-06-16 ENCOUNTER — Encounter: Payer: Self-pay | Admitting: Neurology

## 2024-06-16 ENCOUNTER — Ambulatory Visit (INDEPENDENT_AMBULATORY_CARE_PROVIDER_SITE_OTHER): Payer: Medicaid Other | Admitting: Neurology

## 2024-06-16 VITALS — BP 127/81 | HR 82 | Ht 68.0 in | Wt 185.0 lb

## 2024-06-16 DIAGNOSIS — Z5181 Encounter for therapeutic drug level monitoring: Secondary | ICD-10-CM | POA: Diagnosis not present

## 2024-06-16 DIAGNOSIS — G40019 Localization-related (focal) (partial) idiopathic epilepsy and epileptic syndromes with seizures of localized onset, intractable, without status epilepticus: Secondary | ICD-10-CM | POA: Diagnosis not present

## 2024-06-16 MED ORDER — LAMOTRIGINE ER 300 MG PO TB24: 1.0000 | ORAL_TABLET | Freq: Every day | ORAL | 3 refills | Status: AC

## 2024-06-16 NOTE — Patient Instructions (Signed)
 Continue with Clobazam  10 mg nightly  Continue with Lamotrigine  XR 300 at bedtime  Please schedule sleep study with Dr. Buck  Will complete disability paperwork  Return in a year or sooner if worse
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
## 2024-06-21 ENCOUNTER — Ambulatory Visit: Payer: Self-pay | Admitting: Neurology

## 2024-06-21 LAB — COMPREHENSIVE METABOLIC PANEL WITH GFR
ALT: 14 IU/L (ref 0–44)
AST: 18 IU/L (ref 0–40)
Albumin: 4.7 g/dL (ref 4.1–5.1)
Alkaline Phosphatase: 114 IU/L (ref 44–121)
BUN/Creatinine Ratio: 5 — ABNORMAL LOW (ref 9–20)
BUN: 6 mg/dL (ref 6–24)
Bilirubin Total: 0.3 mg/dL (ref 0.0–1.2)
CO2: 24 mmol/L (ref 20–29)
Calcium: 9.7 mg/dL (ref 8.7–10.2)
Chloride: 98 mmol/L (ref 96–106)
Creatinine, Ser: 1.29 mg/dL — ABNORMAL HIGH (ref 0.76–1.27)
Globulin, Total: 2.5 g/dL (ref 1.5–4.5)
Glucose: 69 mg/dL — ABNORMAL LOW (ref 70–99)
Potassium: 4 mmol/L (ref 3.5–5.2)
Sodium: 137 mmol/L (ref 134–144)
Total Protein: 7.2 g/dL (ref 6.0–8.5)
eGFR: 69 mL/min/1.73 (ref >59)

## 2024-06-21 LAB — CLOBAZAM
Clobazam: 217 ng/mL (ref 30–300)
Desmethylclobazam: 226 ng/mL — ABNORMAL LOW (ref 300–3000)

## 2024-06-21 LAB — LAMOTRIGINE LEVEL: Lamotrigine Lvl: 7.5 ug/mL (ref 2.0–20.0)

## 2024-08-15 ENCOUNTER — Other Ambulatory Visit: Payer: Self-pay | Admitting: Neurology

## 2024-08-16 NOTE — Telephone Encounter (Signed)
 Pt wife called to request medication refill   cloBAZam  (ONFI ) 10 MG tablet    [id number] - [place], [place] - [address] AT [facility] [address] (Ph: [phone])

## 2024-08-16 NOTE — Telephone Encounter (Signed)
 Requested Prescriptions   Pending Prescriptions Disp Refills   cloBAZam  (ONFI ) 10 MG tablet [Pharmacy Med Name: CLOBAZAM  10 MG TABLET] 30 tablet     Sig: TAKE 1 TABLET BY MOUTH EVERYDAY AT BEDTIME   Last seen 06/16/24 next appt 09/08/24  Dispenses   Dispensed Days Supply Quantity Provider Pharmacy  CLOBAZAM  10 MG TABLET 07/15/2024 30 30 each Camara, Amadou, MD [facility] [phone] - [facility]...  CLOBAZAM  10 MG TABLET 06/15/2024 30 30 each Camara, Amadou, MD [facility] [phone] - [facility]...  CLOBAZAM  10 MG TABLET 05/16/2024 30 30 each Camara, Amadou, MD [facility] [phone] - [facility]...  CLOBAZAM  10 MG TABLET 04/17/2024 30 30 each Camara, Amadou, MD [facility] [phone] - [facility]...  CLOBAZAM  10 MG TABLET 03/17/2024 30 30 each Camara, Amadou, MD [facility] [phone] - [facility]...  CLOBAZAM  10 MG TABLET 02/17/2024 30 30 each Camara, Amadou, MD [facility] [phone] - [facility]...  CLOBAZAM  10 MG TABLET 01/18/2024 30 30 each Camara, Amadou, MD [facility] [phone] - G...  CLOBAZAM  10 MG TABLET 12/16/2023 30 30 each Camara, Amadou, MD [facility] [phone] - G...  CLOBAZAM  10 MG TABLET 11/16/2023 30 30 each Camara, Amadou, MD [facility] [phone] - G...  CLOBAZAM  10 MG TABLET 10/16/2023 30 30 each Camara, Amadou, MD [facility] [phone] - G...  CLOBAZAM  10 MG TABLET 09/17/2023 30 30 each Camara, Amadou, MD [facility] [phone] - G.[email or web address]

## 2024-09-08 ENCOUNTER — Ambulatory Visit: Admitting: Neurology

## 2024-09-08 ENCOUNTER — Encounter: Payer: Self-pay | Admitting: Neurology

## 2024-09-08 VITALS — BP 133/78 | HR 76 | Ht 68.0 in | Wt 185.0 lb

## 2024-09-08 DIAGNOSIS — E663 Overweight: Secondary | ICD-10-CM | POA: Diagnosis not present

## 2024-09-08 DIAGNOSIS — Z820 Family history of epilepsy and other diseases of the nervous system: Secondary | ICD-10-CM

## 2024-09-08 DIAGNOSIS — G4733 Obstructive sleep apnea (adult) (pediatric): Secondary | ICD-10-CM | POA: Diagnosis not present

## 2024-09-08 DIAGNOSIS — R519 Headache, unspecified: Secondary | ICD-10-CM

## 2024-09-08 DIAGNOSIS — G40019 Localization-related (focal) (partial) idiopathic epilepsy and epileptic syndromes with seizures of localized onset, intractable, without status epilepticus: Secondary | ICD-10-CM | POA: Diagnosis not present
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
## 2024-09-08 NOTE — Patient Instructions (Signed)
 It was nice to see you again today.  As discussed, I will order a sleep study so we can reevaluate your sleep apnea and we will consider treatment with a new CPAP or AutoPap machine again.

## 2024-09-21 ENCOUNTER — Telehealth: Payer: Self-pay | Admitting: Neurology

## 2024-09-21 NOTE — Telephone Encounter (Signed)
 NPSG [facility] pending

## 2024-09-26 NOTE — Telephone Encounter (Signed)
 NPSG [facility] shara: [id number] (exp. 09/21/24 to 12/21/24)

## 2024-10-02 ENCOUNTER — Emergency Department (HOSPITAL_COMMUNITY)
Admission: EM | Admit: 2024-10-02 | Discharge: 2024-10-02 | Disposition: A | Discharge status: Home / Self Care | Attending: Emergency Medicine | Admitting: Emergency Medicine

## 2024-10-02 ENCOUNTER — Other Ambulatory Visit: Payer: Self-pay

## 2024-10-02 DIAGNOSIS — R569 Unspecified convulsions: Secondary | ICD-10-CM | POA: Insufficient documentation

## 2024-10-02 LAB — CBC
HCT: 44.3 % (ref 39.0–52.0)
Hemoglobin: 15.1 g/dL (ref 13.0–17.0)
MCH: 32.1 pg (ref 26.0–34.0)
MCHC: 34.1 g/dL (ref 30.0–36.0)
MCV: 94.3 fL (ref 80.0–100.0)
Platelets: 216 K/uL (ref 150–400)
RBC: 4.7 MIL/uL (ref 4.22–5.81)
RDW: 12.9 % (ref 11.5–15.5)
WBC: 5.4 K/uL (ref 4.0–10.5)
nRBC: 0 % (ref 0.0–0.2)

## 2024-10-02 LAB — COMPREHENSIVE METABOLIC PANEL WITH GFR
ALT: 12 U/L (ref 0–44)
AST: 24 U/L (ref 15–41)
Albumin: 4.2 g/dL (ref 3.5–5.0)
Alkaline Phosphatase: 106 U/L (ref 38–126)
Anion gap: 8 (ref 5–15)
BUN: 6 mg/dL (ref 6–20)
CO2: 28 mmol/L (ref 22–32)
Calcium: 9.4 mg/dL (ref 8.9–10.3)
Chloride: 101 mmol/L (ref 98–111)
Creatinine, Ser: 1.22 mg/dL (ref 0.61–1.24)
GFR, Estimated: >60 mL/min (ref >60)
Glucose, Bld: 105 mg/dL — ABNORMAL HIGH (ref 70–99)
Potassium: 5.1 mmol/L (ref 3.5–5.1)
Sodium: 136 mmol/L (ref 135–145)
Total Bilirubin: 0.4 mg/dL (ref 0.0–1.2)
Total Protein: 7 g/dL (ref 6.5–8.1)

## 2024-10-02 LAB — I-STAT CG4 LACTIC ACID, ED: Lactic Acid, Venous: 1.1 mmol/L (ref 0.5–1.9)

## 2024-10-02 MED ORDER — LEVETIRACETAM (KEPPRA) 500 MG/5 ML ADULT IV PUSH
1500.0000 mg | Freq: Once | INTRAVENOUS | Status: AC
  Administered 2024-10-02: 1500 mg via INTRAVENOUS
  Filled 2024-10-02: qty 15

## 2024-10-02 NOTE — ED Triage Notes (Signed)
 BIB EMS from home. Wife reports total of 3 seizures within 2 hours.  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  Wife reported full body lock up and staring off.  Gave 15 mg IN diazepam  which wife reports normally gets him to return to baseline however pt still appears post ictal.  Was able to stand and pivot to bed from EMS stretcher.

## 2024-10-02 NOTE — ED Provider Notes (Signed)
 [place] EMERGENCY DEPARTMENT AT [facility] Provider Note   CSN: [id number] Arrival date & time: 10/02/24  1511     Patient presents with: Seizures   Zander Ingham III is a 48 y.o. 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  Patient is compliant with clobazam  and lamotrigine  as prescribed.  Last seizure was a few months ago.  Wife reports patient had 3 seizures within a span of 2 hours earlier today.  Wife administered 15 mg intranasal Valium.  He remained postictal but without additional seizure activity.  Based on recent neurology notes there is a plan to discuss VNS for refractory seizures.  No fevers chills recent illness or associated injuries from seizures    Seizures      Prior to Admission medications   Medication Sig Start Date End Date Taking? Authorizing Provider  amLODipine (NORVASC) 5 MG tablet Take 5 mg by mouth daily. 07/28/24   [provider]  baclofen  (LIORESAL ) 10 MG tablet Take 10 mg by mouth 2 (two) times daily. As needed    [provider]  cloBAZam  (ONFI ) 10 MG tablet TAKE 1 TABLET BY MOUTH EVERYDAY AT BEDTIME 08/16/24   Gregg Lek, MD  LamoTRIgine  300 MG TB24 24 hour tablet Take 1 tablet (300 mg total) by mouth at bedtime. 06/16/24 06/11/25  Camara, Amadou, MD  losartan (COZAAR) 25 MG tablet Take 25 mg by mouth daily. 02/05/22   [provider]  ondansetron  (ZOFRAN ) 4 MG tablet Take 1 tablet (4 mg total) by mouth every 8 (eight) hours as needed for nausea or vomiting. 05/04/23   Burnetta Aures, MD  oxyCODONE -acetaminophen  (PERCOCET) 10-325 MG tablet Take 1 tablet by mouth every 8 (eight) hours as needed. 11/09/23   [provider]    Allergies: Depakote  [divalproex  sodium]    Review of Systems  Neurological:  Positive for seizures.    Updated Vital Signs BP 117/76   Pulse 63   Temp 97.9 F (36.6 C)   Resp 10   SpO2 97%   Physical  Exam Vitals and nursing note reviewed.  HENT:     Head: Normocephalic and atraumatic.     Mouth/Throat:     Comments: No evidence of tongue biting Eyes:     Pupils: Pupils are equal, round, and reactive to light.  Cardiovascular:     Rate and Rhythm: Normal rate and regular rhythm.  Pulmonary:     Effort: Pulmonary effort is normal.     Breath sounds: Normal breath sounds.  Abdominal:     Palpations: Abdomen is soft.     Tenderness: There is no abdominal tenderness.  Skin:    General: Skin is warm and dry.  Neurological:     General: No focal deficit present.     Mental Status: He is alert.     Sensory: No sensory deficit.     Motor: No weakness.     Comments: Somnolent but awake alert oriented answering questions No focal neurologic deficits  Psychiatric:        Mood and Affect: Mood normal.     (all labs ordered are listed, but only abnormal results are displayed) Labs Reviewed  COMPREHENSIVE METABOLIC PANEL WITH GFR - Abnormal; Notable for the following components:      Result Value   Glucose, Bld 105 (*)    All other components within normal limits  CBC  I-STAT CG4 LACTIC ACID, ED    EKG:  EKG Interpretation Date/Time:  Sunday October 02 2024 15:45:34 EST Ventricular Rate:  59 PR Interval:  142 QRS Duration:  80 QT Interval:  404 QTC Calculation: 401 R Axis:   82  Text Interpretation: Sinus rhythm Anteroseptal infarct, old Confirmed by Pamella Sharper [phone]) on 10/02/2024 7:42:19 PM  Radiology: No results found.   Procedures   Medications Ordered in the ED  levETIRAcetam  (KEPPRA ) undiluted injection 1,500 mg (1,500 mg Intravenous Given 10/02/24 1630)    Clinical Course as of 10/02/24 2009  Sun Oct 02, 2024  2008 Laboratory workup unremarkable.  No recurrent seizure activity.  Patient now more awake and alert able to ambulate without issue.  Appropriate for discharge with close neurology follow-up at this time.  Return precautions and seizure  precautions were discussed with the patient and his family was at bedside in detail. [MP]    Clinical Course User Index [MP] Pamella Sharper LABOR, DO                                 Medical Decision Making 48 year old male history as above presents to the ED for refractory seizure.  Compliant with clobazam  and lamotrigine .  3 reported seizures at home.  Resolution with intranasal diazepam  from wife.  Postictal grogginess but back to neurologic baseline able to follow commands answer questions.  Will obtain laboratory workup to look for underlying metabolic disturbance continue to monitor on seizure precautions.  Will plan for Keppra  load 1500 mg with the hope of preventing further seizure activity today  Amount and/or Complexity of Data Reviewed Labs: ordered.        Final diagnoses:  Seizure [facility])    ED Discharge Orders     None          Pamella Sharper LABOR, DO 10/02/24 2009

## 2024-10-02 NOTE — Discharge Instructions (Signed)
 You were seen in the emerged ferment after another seizure episode We gave you dose of Keppra  here and observed you for several hours and you did not have any more seizures Continue taking your prescribed seizure medications and follow-up with your neurologist at [facility] to discuss today's visit and further management of your seizures Return to the Emergency Department for repeated seizures or any other concerns

## 2024-10-03 ENCOUNTER — Encounter: Payer: Self-pay | Admitting: Neurology

## 2024-10-05 ENCOUNTER — Other Ambulatory Visit: Payer: Self-pay | Admitting: Neurology

## 2024-10-05 MED ORDER — CLOBAZAM 10 MG PO TABS: 15.0000 mg | ORAL_TABLET | Freq: Every evening | ORAL | 5 refills | Status: AC

## 2024-10-05 NOTE — Telephone Encounter (Signed)
 Yes that is correct 15 mg (1.5 tablet) nightly.

## 2024-10-10 NOTE — Telephone Encounter (Signed)
 LVM for pt to call back to schedule.

## 2025-06-15 ENCOUNTER — Ambulatory Visit: Admitting: Neurology
